# Patient Record
Sex: Male | Born: 1980 | Race: White | Hispanic: No | Marital: Single | Smoking: Current every day smoker
Health system: Southern US, Community
[De-identification: ages and names within clinical notes are randomized; demographics above are authoritative.]

## PROBLEM LIST (undated history)

## (undated) DIAGNOSIS — F329 Major depressive disorder, single episode, unspecified: Secondary | ICD-10-CM

## (undated) DIAGNOSIS — F32A Depression, unspecified: Secondary | ICD-10-CM

## (undated) DIAGNOSIS — F419 Anxiety disorder, unspecified: Secondary | ICD-10-CM

## (undated) DIAGNOSIS — G4700 Insomnia, unspecified: Secondary | ICD-10-CM

## (undated) HISTORY — PX: TONSILLECTOMY: SUR1361

## (undated) HISTORY — DX: Insomnia, unspecified: G47.00

## (undated) HISTORY — DX: Anxiety disorder, unspecified: F41.9

## (undated) HISTORY — DX: Major depressive disorder, single episode, unspecified: F32.9

## (undated) HISTORY — DX: Depression, unspecified: F32.A

---

## 2007-09-13 ENCOUNTER — Emergency Department (HOSPITAL_COMMUNITY): Admission: EM | Admit: 2007-09-13 | Discharge: 2007-09-13 | Payer: Self-pay | Admitting: Family Medicine

## 2013-01-14 ENCOUNTER — Emergency Department: Payer: Self-pay | Admitting: Emergency Medicine

## 2013-01-14 LAB — CBC
HCT: 48.2 % (ref 40.0–52.0)
HGB: 15.5 g/dL (ref 13.0–18.0)
Platelet: 205 10*3/uL (ref 150–440)
RBC: 5.69 10*6/uL (ref 4.40–5.90)
RDW: 14 % (ref 11.5–14.5)

## 2013-01-14 LAB — URINALYSIS, COMPLETE
Bacteria: NONE SEEN
Glucose,UR: NEGATIVE mg/dL (ref 0–75)
Ketone: NEGATIVE
Leukocyte Esterase: NEGATIVE
Ph: 5 (ref 4.5–8.0)
Protein: 100
RBC,UR: 1 /HPF (ref 0–5)
Specific Gravity: 1.024 (ref 1.003–1.030)
WBC UR: 1 /HPF (ref 0–5)

## 2013-01-14 LAB — DRUG SCREEN, URINE
Barbiturates, Ur Screen: NEGATIVE (ref ?–200)
MDMA (Ecstasy)Ur Screen: NEGATIVE (ref ?–500)
Methadone, Ur Screen: NEGATIVE (ref ?–300)
Opiate, Ur Screen: NEGATIVE (ref ?–300)
Phencyclidine (PCP) Ur S: NEGATIVE (ref ?–25)
Tricyclic, Ur Screen: NEGATIVE (ref ?–1000)

## 2013-01-14 LAB — COMPREHENSIVE METABOLIC PANEL
Albumin: 4.3 g/dL (ref 3.4–5.0)
Alkaline Phosphatase: 99 U/L
BUN: 11 mg/dL (ref 7–18)
Bilirubin,Total: 0.3 mg/dL (ref 0.2–1.0)
Chloride: 109 mmol/L — ABNORMAL HIGH (ref 98–107)
Co2: 27 mmol/L (ref 21–32)
Creatinine: 0.74 mg/dL (ref 0.60–1.30)
EGFR (Non-African Amer.): >60
Glucose: 113 mg/dL — ABNORMAL HIGH (ref 65–99)
SGPT (ALT): 115 U/L — ABNORMAL HIGH (ref 12–78)
Sodium: 142 mmol/L (ref 136–145)

## 2013-01-14 LAB — SALICYLATE LEVEL: Salicylates, Serum: <1.7 mg/dL

## 2013-01-14 LAB — ETHANOL: Ethanol: 101 mg/dL

## 2013-02-24 ENCOUNTER — Ambulatory Visit (INDEPENDENT_AMBULATORY_CARE_PROVIDER_SITE_OTHER): Payer: 59 | Admitting: Family Medicine

## 2013-02-24 VITALS — BP 128/92 | HR 99 | Temp 99.5°F | Resp 18 | Ht 68.0 in | Wt 300.0 lb

## 2013-02-24 DIAGNOSIS — I10 Essential (primary) hypertension: Secondary | ICD-10-CM

## 2013-02-24 DIAGNOSIS — G479 Sleep disorder, unspecified: Secondary | ICD-10-CM

## 2013-02-24 DIAGNOSIS — F411 Generalized anxiety disorder: Secondary | ICD-10-CM

## 2013-02-24 DIAGNOSIS — F3289 Other specified depressive episodes: Secondary | ICD-10-CM

## 2013-02-24 DIAGNOSIS — F329 Major depressive disorder, single episode, unspecified: Secondary | ICD-10-CM

## 2013-02-24 DIAGNOSIS — E669 Obesity, unspecified: Secondary | ICD-10-CM

## 2013-02-24 DIAGNOSIS — F32A Depression, unspecified: Secondary | ICD-10-CM

## 2013-02-24 MED ORDER — LORAZEPAM 0.5 MG PO TABS: ORAL_TABLET | ORAL | Status: DC

## 2013-02-24 MED ORDER — FLUOXETINE HCL 20 MG PO CAPS: 20.0000 mg | ORAL_CAPSULE | Freq: Every day | ORAL | Status: DC

## 2013-02-24 NOTE — Patient Instructions (Signed)
Take medication regularly  Followup with you new primary care Dr. and let him check your blood pressure.  Take the lorazepam one at bedtime for sleep.  Get regular exercise and eat less

## 2013-02-24 NOTE — Progress Notes (Signed)
Subjective Patient has a history of depression. For a little over a month he's been on antidepressants, and was much better. He ran out 6 days ago and realizes how he is getting worse again. He would like a refill medications. He has an appointment to see a new family physician at the [facility] in a couple of weeks, but the last he couldn't wait until then to get his medications. Otherwise she's been pretty healthy. His blood pressures crept up with his weight, and today his weight of 300 was a mind opener to him.  Objective: Alert oriented overweight gentleman. Blood pressure elevation was discussed.  Assessment Depression Hypertension Anxiety  Plan: He wait and discuss his blood pressure when he sees he is new primary Care Dr. in a few weeks. Will restart his medications today. Told him that it will take a few days for them to kick in to gear again.

## 2013-03-09 ENCOUNTER — Ambulatory Visit (INDEPENDENT_AMBULATORY_CARE_PROVIDER_SITE_OTHER): Payer: 59 | Admitting: Internal Medicine

## 2013-03-09 ENCOUNTER — Encounter: Payer: Self-pay | Admitting: Internal Medicine

## 2013-03-09 VITALS — BP 148/100 | HR 74 | Temp 97.5°F | Ht 68.25 in | Wt 304.0 lb

## 2013-03-09 DIAGNOSIS — Z0000 Encounter for general adult medical examination without abnormal findings: Secondary | ICD-10-CM

## 2013-03-09 DIAGNOSIS — I10 Essential (primary) hypertension: Secondary | ICD-10-CM

## 2013-03-09 LAB — COMPREHENSIVE METABOLIC PANEL
ALT: 49 U/L (ref 0–53)
AST: 38 U/L — ABNORMAL HIGH (ref 0–37)
Albumin: 4.1 g/dL (ref 3.5–5.2)
Alkaline Phosphatase: 83 U/L (ref 39–117)
BILIRUBIN TOTAL: 0.4 mg/dL (ref 0.2–1.2)
BUN: 17 mg/dL (ref 6–23)
CO2: 25 mEq/L (ref 19–32)
Calcium: 9.5 mg/dL (ref 8.4–10.5)
Chloride: 105 mEq/L (ref 96–112)
Creat: 0.76 mg/dL (ref 0.50–1.35)
Glucose, Bld: 88 mg/dL (ref 70–99)
Potassium: 4.1 mEq/L (ref 3.5–5.3)
Sodium: 139 mEq/L (ref 135–145)
Total Protein: 6.8 g/dL (ref 6.0–8.3)

## 2013-03-09 LAB — LIPID PANEL
CHOL/HDL RATIO: 4.2 ratio
CHOLESTEROL: 171 mg/dL (ref 0–200)
HDL: 41 mg/dL (ref >39)
LDL Cholesterol: 63 mg/dL (ref 0–99)
TRIGLYCERIDES: 335 mg/dL — AB (ref <150)
VLDL: 67 mg/dL — ABNORMAL HIGH (ref 0–40)

## 2013-03-09 LAB — HEMOGLOBIN A1C
Hgb A1c MFr Bld: 5.9 % — ABNORMAL HIGH (ref <5.7)
Mean Plasma Glucose: 123 mg/dL — ABNORMAL HIGH (ref <117)

## 2013-03-09 LAB — CBC
HCT: 42.7 % (ref 39.0–52.0)
HEMOGLOBIN: 14.9 g/dL (ref 13.0–17.0)
MCH: 28.4 pg (ref 26.0–34.0)
MCHC: 34.9 g/dL (ref 30.0–36.0)
MCV: 81.5 fL (ref 78.0–100.0)
Platelets: 202 10*3/uL (ref 150–400)
RBC: 5.24 MIL/uL (ref 4.22–5.81)
RDW: 13.6 % (ref 11.5–15.5)
WBC: 8.5 10*3/uL (ref 4.0–10.5)

## 2013-03-09 LAB — TSH: TSH: 1.648 u[IU]/mL (ref 0.350–4.500)

## 2013-03-09 MED ORDER — LISINOPRIL-HYDROCHLOROTHIAZIDE 10-12.5 MG PO TABS: 1.0000 | ORAL_TABLET | Freq: Every day | ORAL | Status: DC

## 2013-03-09 NOTE — Progress Notes (Signed)
HPI  Pt presents to the clinic today to establish care. [facility] He does have some concerns today about his weight and blood pressure. He has gained a lot of weight since he has been married. He has not tried to work on his diet or exercise. He knows his blood pressure is due to his weight.  Flu: 2013 Tetanus: Unsure of date Dentist: Biannually  Past Medical History  Diagnosis Date  . Depression   . Anxiety   . Insomnia     Current Outpatient Prescriptions  Medication Sig Dispense Refill  . FLUoxetine (PROZAC) 20 MG capsule Take 1 capsule (20 mg total) by mouth daily.  30 capsule  1  . LORazepam (ATIVAN) 0.5 MG tablet Take one at bedtime  30 tablet  0   No current facility-administered medications for this visit.    Allergies  Allergen Reactions  . Tylenol [Acetaminophen]     Family History  Problem Relation Age of Onset  . Mental illness Mother   . Hypertension Father     History   Social History  . Marital Status: Married    Spouse Name: N/A    Number of Children: N/A  . Years of Education: N/A   Occupational History  . Not on file.   Social History Main Topics  . Smoking status: Former Games developermoker  . Smokeless tobacco: Not on file  . Alcohol Use: No  . Drug Use: No  . Sexual Activity: Not on file   Other Topics Concern  . Not on file   Social History Narrative  . No narrative on file    ROS:  Constitutional: Denies fever, malaise, fatigue, headache or abrupt weight changes.  HEENT: Denies eye pain, eye redness, ear pain, ringing in the ears, wax buildup, runny nose, nasal congestion, bloody nose, or sore throat. Respiratory: Denies difficulty breathing, shortness of breath, cough or sputum production.   Cardiovascular: Denies chest pain, chest tightness, palpitations or swelling in the hands or feet.  Gastrointestinal: Denies abdominal pain, bloating, constipation, diarrhea or blood in the stool.  GU: Denies frequency,  urgency, pain with urination, blood in urine, odor or discharge. Musculoskeletal: Denies decrease in range of motion, difficulty with gait, muscle pain or joint pain and swelling.  Skin: Denies redness, rashes, lesions or ulcercations.  Neurological: Denies dizziness, difficulty with memory, difficulty with speech or problems with balance and coordination.   No other specific complaints in a complete review of systems (except as listed in HPI above).  PE:  BP 148/100  Pulse 74  Temp(Src) 97.5 F (36.4 C) (Oral)  Ht 5' 8.25" (1.734 m)  Wt 304 lb (137.893 kg)  BMI 45.86 kg/m2  SpO2 98% Wt Readings from Last 3 Encounters:  03/09/13 304 lb (137.893 kg)  02/24/13 300 lb (136.079 kg)    General: Appears his stated age, obese but well developed, well nourished in NAD. HEENT: Head: normal shape and size; Eyes: sclera white, no icterus, conjunctiva pink, PERRLA and EOMs intact; Ears: Tm's gray and intact, normal light reflex; Nose: mucosa pink and moist, septum midline; Throat/Mouth: Teeth present, mucosa pink and moist, no lesions or ulcerations noted.  Neck: Normal range of motion. Neck supple, trachea midline. No massses, lumps or thyromegaly present.  Cardiovascular: Normal rate and rhythm. S1,S2 noted.  No murmur, rubs or gallops noted. No JVD or BLE edema. No carotid bruits noted. Pulmonary/Chest: Normal effort and positive vesicular breath sounds. No respiratory distress. No wheezes, rales or  ronchi noted.  Abdomen: Soft and nontender. Normal bowel sounds, no bruits noted. No distention or masses noted. Liver, spleen and kidneys non palpable. Musculoskeletal: Normal range of motion. No signs of joint swelling. No difficulty with gait.  Neurological: Alert and oriented. Cranial nerves II-XII intact. Coordination normal. +DTRs bilaterally. Psychiatric: Mood and affect normal. Behavior is normal. Judgment and thought content normal.      Assessment and Plan:  Preventative  Health:  He declines flu and Tdap today Encouraged him to work on diet and exercise Will obtain screening labs today

## 2013-03-09 NOTE — Progress Notes (Signed)
Pre-visit discussion using our clinic review tool. No additional management support is needed unless otherwise documented below in the visit note.  

## 2013-03-09 NOTE — Patient Instructions (Addendum)

## 2013-03-09 NOTE — Assessment & Plan Note (Signed)
Encouraged him to maintain a 2000 calorie diet and try to get at least 4 30 minute sessions of aerobic exercise in per week.

## 2013-03-09 NOTE — Assessment & Plan Note (Signed)
Will start lisinopril-hct Encouraged him to work on diet and exercise with a goal of 10 lb weight loss over the next month  RTC in 1 month to recheck BP

## 2013-04-05 ENCOUNTER — Other Ambulatory Visit: Payer: Self-pay | Admitting: Internal Medicine

## 2013-05-17 ENCOUNTER — Ambulatory Visit (INDEPENDENT_AMBULATORY_CARE_PROVIDER_SITE_OTHER): Payer: 59 | Admitting: Internal Medicine

## 2013-05-17 ENCOUNTER — Encounter: Payer: Self-pay | Admitting: Internal Medicine

## 2013-05-17 VITALS — BP 146/92 | HR 98 | Temp 98.7°F | Wt 296.0 lb

## 2013-05-17 DIAGNOSIS — F419 Anxiety disorder, unspecified: Secondary | ICD-10-CM

## 2013-05-17 DIAGNOSIS — F32A Depression, unspecified: Secondary | ICD-10-CM

## 2013-05-17 DIAGNOSIS — F3289 Other specified depressive episodes: Secondary | ICD-10-CM

## 2013-05-17 DIAGNOSIS — I10 Essential (primary) hypertension: Secondary | ICD-10-CM

## 2013-05-17 DIAGNOSIS — F329 Major depressive disorder, single episode, unspecified: Secondary | ICD-10-CM

## 2013-05-17 MED ORDER — LISINOPRIL-HYDROCHLOROTHIAZIDE 20-25 MG PO TABS: 1.0000 | ORAL_TABLET | Freq: Every day | ORAL | Status: DC

## 2013-05-17 MED ORDER — FLUOXETINE HCL 20 MG PO CAPS: 20.0000 mg | ORAL_CAPSULE | Freq: Every day | ORAL | Status: DC

## 2013-05-17 NOTE — Assessment & Plan Note (Signed)
Not a whole lot of improvement since starting medication Will increase lisinopril-HCT to 20-25  RTC in 1 month to recheck BP and BMET

## 2013-05-17 NOTE — Progress Notes (Signed)
Subjective:    Patient ID: Anthony Myers, male    DOB: 07/25/1980, 33 y.o.   MRN: [id number]  HPI  Pt presents to the clinic today for followup and med refills.  Depression: On prozac and ativan. Only takes ativan when needed. He denies SI/HI.  HTN: No real improvement lisinopril-HCT. He has lost 8 lbs.  Review of Systems      Past Medical History  Diagnosis Date  . Depression   . Anxiety   . Insomnia     Current Outpatient Prescriptions  Medication Sig Dispense Refill  . FLUoxetine (PROZAC) 20 MG capsule Take 1 capsule (20 mg total) by mouth daily.  30 capsule  1  . lisinopril-hydrochlorothiazide (PRINZIDE,ZESTORETIC) 10-12.5 MG per tablet Take 1 tablet by mouth daily.  30 tablet  0  . LORazepam (ATIVAN) 0.5 MG tablet Take one at bedtime  30 tablet  0   No current facility-administered medications for this visit.    Allergies  Allergen Reactions  . Tylenol [Acetaminophen]     Family History  Problem Relation Age of Onset  . Mental illness Mother   . Hypertension Father     History   Social History  . Marital Status: Married    Spouse Name: N/A    Number of Children: N/A  . Years of Education: N/A   Occupational History  . Not on file.   Social History Main Topics  . Smoking status: Former Games developermoker  . Smokeless tobacco: Not on file  . Alcohol Use: No  . Drug Use: No  . Sexual Activity: Not on file   Other Topics Concern  . Not on file   Social History Narrative  . No narrative on file     Constitutional: Denies fever, malaise, fatigue, headache or abrupt weight changes.  Respiratory: Denies difficulty breathing, shortness of breath, cough or sputum production.   Cardiovascular: Denies chest pain, chest tightness, palpitations or swelling in the hands or feet.  Neurological: Denies dizziness, difficulty with memory, difficulty with speech or problems with balance and coordination.  Psych: Pt reports depression, mild anxiety. Denies SI/HI.  No  other specific complaints in a complete review of systems (except as listed in HPI above).  Objective:   Physical Exam   BP 146/92  Pulse 98  Temp(Src) 98.7 F (37.1 C) (Oral)  Wt 296 lb (134.265 kg)  SpO2 98% Wt Readings from Last 3 Encounters:  05/17/13 296 lb (134.265 kg)  03/09/13 304 lb (137.893 kg)  02/24/13 300 lb (136.079 kg)    General: Appears his stated age, obese but well developed, well nourished in NAD. Cardiovascular: Normal rate and rhythm. S1,S2 noted.  No murmur, rubs or gallops noted. No JVD or BLE edema. No carotid bruits noted. Pulmonary/Chest: Normal effort and positive vesicular breath sounds. No respiratory distress. No wheezes, rales or ronchi noted.  Psychiatric: Mood and affect normal. Behavior is normal. Judgment and thought content normal.     BMET    Component Value Date/Time   NA 139 03/09/2013 1601   K 4.1 03/09/2013 1601   CL 105 03/09/2013 1601   CO2 25 03/09/2013 1601   GLUCOSE 88 03/09/2013 1601   BUN 17 03/09/2013 1601   CREATININE 0.76 03/09/2013 1601   CALCIUM 9.5 03/09/2013 1601    Lipid Panel     Component Value Date/Time   CHOL 171 03/09/2013 1601   TRIG 335* 03/09/2013 1601   HDL 41 03/09/2013 1601   CHOLHDL 4.2 03/09/2013 1601  VLDL 67* 03/09/2013 1601   LDLCALC 63 03/09/2013 1601    CBC    Component Value Date/Time   WBC 8.5 03/09/2013 1601   RBC 5.24 03/09/2013 1601   HGB 14.9 03/09/2013 1601   HCT 42.7 03/09/2013 1601   PLT 202 03/09/2013 1601   MCV 81.5 03/09/2013 1601   MCH 28.4 03/09/2013 1601   MCHC 34.9 03/09/2013 1601   RDW 13.6 03/09/2013 1601    Hgb A1C Lab Results  Component Value Date   HGBA1C 5.9* 03/09/2013        Assessment & Plan:

## 2013-05-17 NOTE — Assessment & Plan Note (Signed)
He has lost 8 lbs Encouraged him to continue to work on diet and exercise

## 2013-05-17 NOTE — Patient Instructions (Addendum)

## 2013-05-17 NOTE — Assessment & Plan Note (Signed)
Controlled on Prozac with supplemental ativan Medication refilled today

## 2013-05-17 NOTE — Progress Notes (Signed)
Pre visit review using our clinic review tool, if applicable. No additional management support is needed unless otherwise documented below in the visit note. 

## 2013-05-18 ENCOUNTER — Telehealth: Payer: Self-pay | Admitting: Internal Medicine

## 2013-05-18 NOTE — Telephone Encounter (Signed)
Relevant patient education assigned to patient using Emmi. ° °

## 2013-06-21 ENCOUNTER — Other Ambulatory Visit: Payer: Self-pay | Admitting: Internal Medicine

## 2013-07-10 ENCOUNTER — Other Ambulatory Visit: Payer: Self-pay | Admitting: Internal Medicine

## 2013-07-23 ENCOUNTER — Other Ambulatory Visit: Payer: Self-pay | Admitting: Internal Medicine

## 2013-07-23 DIAGNOSIS — F329 Major depressive disorder, single episode, unspecified: Secondary | ICD-10-CM

## 2013-07-23 DIAGNOSIS — F3289 Other specified depressive episodes: Secondary | ICD-10-CM

## 2013-07-23 NOTE — Telephone Encounter (Signed)
Last OV and last filled 05/17/13--please advise

## 2013-08-14 ENCOUNTER — Other Ambulatory Visit: Payer: Self-pay | Admitting: Internal Medicine

## 2013-10-18 ENCOUNTER — Other Ambulatory Visit: Payer: Self-pay | Admitting: Internal Medicine

## 2013-10-19 ENCOUNTER — Other Ambulatory Visit: Payer: Self-pay | Admitting: Family Medicine

## 2013-10-19 DIAGNOSIS — F329 Major depressive disorder, single episode, unspecified: Secondary | ICD-10-CM

## 2013-10-19 DIAGNOSIS — F3289 Other specified depressive episodes: Secondary | ICD-10-CM

## 2013-10-19 NOTE — Telephone Encounter (Signed)
Pt left vm on triage phone stating that he requested Prozac and he has not heard anything back.  Shawna Orleans, CMA denied request on 9/24.  Pt was last seen on 05/17/13 for depression.  Ok to fill or does pt need another appt?

## 2013-10-22 MED ORDER — FLUOXETINE HCL 20 MG PO CAPS: ORAL_CAPSULE | ORAL | Status: DC

## 2013-11-29 ENCOUNTER — Ambulatory Visit (INDEPENDENT_AMBULATORY_CARE_PROVIDER_SITE_OTHER): Payer: 59 | Admitting: Internal Medicine

## 2013-11-29 ENCOUNTER — Encounter: Payer: Self-pay | Admitting: Internal Medicine

## 2013-11-29 VITALS — BP 148/110 | HR 87 | Temp 98.1°F | Wt 314.0 lb

## 2013-11-29 DIAGNOSIS — I10 Essential (primary) hypertension: Secondary | ICD-10-CM

## 2013-11-29 DIAGNOSIS — H6091 Unspecified otitis externa, right ear: Secondary | ICD-10-CM

## 2013-11-29 DIAGNOSIS — H65111 Acute and subacute allergic otitis media (mucoid) (sanguinous) (serous), right ear: Secondary | ICD-10-CM | POA: Diagnosis not present

## 2013-11-29 MED ORDER — LISINOPRIL 20 MG PO TABS: 20.0000 mg | ORAL_TABLET | Freq: Every day | ORAL | Status: DC

## 2013-11-29 MED ORDER — AMOXICILLIN 875 MG PO TABS: 875.0000 mg | ORAL_TABLET | Freq: Two times a day (BID) | ORAL | Status: DC

## 2013-11-29 MED ORDER — AMLODIPINE BESYLATE 5 MG PO TABS: 5.0000 mg | ORAL_TABLET | Freq: Every day | ORAL | Status: DC

## 2013-11-29 NOTE — Progress Notes (Signed)
Subjective:    Patient ID: Anthony Myers, male    DOB: 04/16/1980, 33 y.o.   MRN: [id number]  HPI Pt is a 33 yr old male presenting with right ear pain.  Pt has a PMH of chronic ear infections, had tubes placed when 33 yrs old and removed at 33 yrs old.  Pain started yesterday night, felt that there was something in his ear.  Pain worse over night and feels like there is drainage in his ear.  Pt reports redness, pain and swelling around his right ear.  Pt denies fever, N/V or diarrhea  Pt also mentions his HTN.  Pt has a BP of 148/110 at presentation today.  Pt was prescribed lisinopril/HCTZ 20-25mg  combo in April.  Pt did not come for follow-up and has made 30 tables last from April to November.  Pt last reports taking the medication 5 days prior to visit.  Pt is taking medication at night and is waking up to pee, wants to try new BP medication.   Review of Systems  Past Medical History  Diagnosis Date  . Depression   . Anxiety   . Insomnia     Current Outpatient Prescriptions  Medication Sig Dispense Refill  . FLUoxetine (PROZAC) 20 MG capsule TAKE 1 CAPSULE (20 MG TOTAL) BY MOUTH DAILY. 30 capsule 1  . LORazepam (ATIVAN) 0.5 MG tablet Take one at bedtime 30 tablet 0  . amLODipine (NORVASC) 5 MG tablet Take 1 tablet (5 mg total) by mouth daily. 30 tablet 0  . amoxicillin (AMOXIL) 875 MG tablet Take 1 tablet (875 mg total) by mouth 2 (two) times daily. 20 tablet 0  . lisinopril (PRINIVIL,ZESTRIL) 20 MG tablet Take 1 tablet (20 mg total) by mouth daily. 30 tablet 0   No current facility-administered medications for this visit.    Allergies  Allergen Reactions  . Tylenol [Acetaminophen]     Family History  Problem Relation Age of Onset  . Mental illness Mother   . Hypertension Father     History   Social History  . Marital Status: Married    Spouse Name: N/A    Number of Children: N/A  . Years of Education: N/A   Occupational History  . Not on file.   Social  History Main Topics  . Smoking status: Former Games developermoker  . Smokeless tobacco: Not on file  . Alcohol Use: No  . Drug Use: No  . Sexual Activity: Not on file   Other Topics Concern  . Not on file   Social History Narrative   Constitutional: Denies fever, malaise, fatigue, headache or abrupt weight changes.  HEENT: Positive ear pain, ear drainage.  Denies eye pain, eye redness, ringing in the ears, wax buildup, runny nose, nasal congestion, bloody nose, or sore throat. Respiratory: Denies difficulty breathing, shortness of breath, cough or sputum production.   Cardiovascular: Denies chest pain, chest tightness, palpitations or swelling in the hands or feet.  Skin: Positive redness around right ear.  Denies rashes, lesions or ulcercations.   No other specific complaints in a complete review of systems (except as listed in HPI above).     Objective:   Physical Exam BP 148/110 mmHg  Pulse 87  Temp(Src) 98.1 F (36.7 C) (Oral)  Wt 314 lb (142.429 kg)  SpO2 98% Wt Readings from Last 3 Encounters:  11/29/13 314 lb (142.429 kg)  05/17/13 296 lb (134.265 kg)  03/09/13 304 lb (137.893 kg)    General: Appears his stated age,  well developed, well nourished in NAD.  Pt has gained weight since previous visit. Skin: Pt has a flushed appearance HEENT: Head: normal shape and size; Eyes: sclera white, no icterus, conjunctiva pink, PERRLA and EOMs intact; Ears: TM intact left ear, Right ear: erythematous canal with perforated TM with noticeable pus draining; Throat/Mouth: Teeth present, mucosa pink and moist, no exudate, lesions or ulcerations noted.  Neck: No JVD or cervical lymphadenopathy noted Cardiovascular: Normal rate and rhythm. S1,S2 noted.  No murmur, rubs or gallops noted. No JVD or BLE edema. No carotid bruits noted. Pulmonary/Chest: Normal effort and positive vesicular breath sounds. No respiratory distress. No wheezes, rales or ronchi noted.   EKG:  BMET    Component Value  Date/Time   NA 139 03/09/2013 1601   K 4.1 03/09/2013 1601   CL 105 03/09/2013 1601   CO2 25 03/09/2013 1601   GLUCOSE 88 03/09/2013 1601   BUN 17 03/09/2013 1601   CREATININE 0.76 03/09/2013 1601   CALCIUM 9.5 03/09/2013 1601    Lipid Panel     Component Value Date/Time   CHOL 171 03/09/2013 1601   TRIG 335* 03/09/2013 1601   HDL 41 03/09/2013 1601   CHOLHDL 4.2 03/09/2013 1601   VLDL 67* 03/09/2013 1601   LDLCALC 63 03/09/2013 1601    CBC    Component Value Date/Time   WBC 8.5 03/09/2013 1601   RBC 5.24 03/09/2013 1601   HGB 14.9 03/09/2013 1601   HCT 42.7 03/09/2013 1601   PLT 202 03/09/2013 1601   MCV 81.5 03/09/2013 1601   MCH 28.4 03/09/2013 1601   MCHC 34.9 03/09/2013 1601   RDW 13.6 03/09/2013 1601    Hgb A1C Lab Results  Component Value Date   HGBA1C 5.9* 03/09/2013      Assessment & Plan:  Otitis Media with associated otitis externa -Prescribed Amoxil BID x 10 days as well as Depo-medrol injection -Told the pt to try to keep moisture out of right ear and try to prevent anything getting into his ear -RTC if symptoms worsen or have not improved following antibiotic  Essential HTN -Pt does not wish to continue lisinopril/HCTZ combo, will D/C -Placed pt on lisinopril 20mg  and Amlodipine 5mg  -F/U in 30 days for re-check of BP and re-evaluation of BP medications

## 2013-11-29 NOTE — Assessment & Plan Note (Signed)
He is bothered by the urinary frequency Will stop Lisinopril-HCT RX for Lisinopril 20 mg daily RX for Norvasc 5 mg daily  RTC in 1 month or sooner if needed

## 2013-11-29 NOTE — Progress Notes (Signed)
Pre visit review using our clinic review tool, if applicable. No additional management support is needed unless otherwise documented below in the visit note. 

## 2013-11-29 NOTE — Progress Notes (Signed)
Subjective:    Patient ID: Anthony BarriosJeremy Myers, male    DOB: 12/13/1980, 33 y.o.   MRN: [id number]  HPI  Pt presents to the clinic today with c/o right ear pain/pressure. He reports this started yesterday. He has noticed swelling around his ear and cheek. He has also had some associated dizziness. He denies fever or chills. He reports history of chronic ear infections. He does report having a "tube" placed in his right ear at age 33 removed at age of 33.  Of note, his BP is elevated today at 148/110. He was started on Lisinopril-HCT 04/2013. He reports that he took it for a little while but essentially stopped it because his was having urinary frequency. He never came back for a follow up appt after that.  Review of Systems      Past Medical History  Diagnosis Date  . Depression   . Anxiety   . Insomnia     Current Outpatient Prescriptions  Medication Sig Dispense Refill  . FLUoxetine (PROZAC) 20 MG capsule TAKE 1 CAPSULE (20 MG TOTAL) BY MOUTH DAILY. 30 capsule 1  . LORazepam (ATIVAN) 0.5 MG tablet Take one at bedtime 30 tablet 0  . lisinopril-hydrochlorothiazide (PRINZIDE,ZESTORETIC) 20-25 MG per tablet TAKE 1 TABLET BY MOUTH DAILY. 30 tablet 0   No current facility-administered medications for this visit.    Allergies  Allergen Reactions  . Tylenol [Acetaminophen]     Family History  Problem Relation Age of Onset  . Mental illness Mother   . Hypertension Father     History   Social History  . Marital Status: Married    Spouse Name: N/A    Number of Children: N/A  . Years of Education: N/A   Occupational History  . Not on file.   Social History Main Topics  . Smoking status: Former Games developermoker  . Smokeless tobacco: Not on file  . Alcohol Use: No  . Drug Use: No  . Sexual Activity: Not on file   Other Topics Concern  . Not on file   Social History Narrative     Constitutional: Denies fever, malaise, fatigue, headache or abrupt weight changes.  HEENT: Pt  reports right ear pain. Denies eye pain, eye redness, ringing in the ears, wax buildup, runny nose, nasal congestion, bloody nose, or sore throat. Respiratory: Denies difficulty breathing, shortness of breath, cough or sputum production.   Cardiovascular: Denies chest pain, chest tightness, palpitations or swelling in the hands or feet.  Neurological: Denies dizziness, difficulty with memory, difficulty with speech or problems with balance and coordination.   No other specific complaints in a complete review of systems (except as listed in HPI above).   Objective:   Physical Exam   BP 148/110 mmHg  Pulse 87  Temp(Src) 98.1 F (36.7 C) (Oral)  Wt 314 lb (142.429 kg)  SpO2 98% Wt Readings from Last 3 Encounters:  11/29/13 314 lb (142.429 kg)  05/17/13 296 lb (134.265 kg)  03/09/13 304 lb (137.893 kg)    General: Appears his stated age, obese but well developed, well nourished in NAD. HEENT: Head: normal shape and size; Ears: right canal erythmetous; pus noted in eardrum. Hole noted in eardrum. Nose: mucosa pink and moist, septum midline; Throat/Mouth: Teeth present, mucosa pink and moist, no exudate, lesions or ulcerations noted.  Cardiovascular: Normal rate and rhythm. S1,S2 noted.  No murmur, rubs or gallops noted. No JVD or BLE edema. No carotid bruits noted. Pulmonary/Chest: Normal effort and positive vesicular breath  sounds. No respiratory distress. No wheezes, rales or ronchi noted.  Neurological: Alert and oriented.   BMET    Component Value Date/Time   NA 139 03/09/2013 1601   K 4.1 03/09/2013 1601   CL 105 03/09/2013 1601   CO2 25 03/09/2013 1601   GLUCOSE 88 03/09/2013 1601   BUN 17 03/09/2013 1601   CREATININE 0.76 03/09/2013 1601   CALCIUM 9.5 03/09/2013 1601    Lipid Panel     Component Value Date/Time   CHOL 171 03/09/2013 1601   TRIG 335* 03/09/2013 1601   HDL 41 03/09/2013 1601   CHOLHDL 4.2 03/09/2013 1601   VLDL 67* 03/09/2013 1601   LDLCALC 63  03/09/2013 1601    CBC    Component Value Date/Time   WBC 8.5 03/09/2013 1601   RBC 5.24 03/09/2013 1601   HGB 14.9 03/09/2013 1601   HCT 42.7 03/09/2013 1601   PLT 202 03/09/2013 1601   MCV 81.5 03/09/2013 1601   MCH 28.4 03/09/2013 1601   MCHC 34.9 03/09/2013 1601   RDW 13.6 03/09/2013 1601    Hgb A1C Lab Results  Component Value Date   HGBA1C 5.9* 03/09/2013        Assessment & Plan:   Right Otitis Media with Otitis Externa:  eRx for Amoxil BID x 10 days  80 mg Depo IM today No otic drops due to hole in ear drum

## 2013-11-29 NOTE — Patient Instructions (Signed)

## 2013-12-03 MED ORDER — METHYLPREDNISOLONE ACETATE 80 MG/ML IJ SUSP
80.0000 mg | Freq: Once | INTRAMUSCULAR | Status: AC
Start: 2013-12-03 — End: 2013-12-03
  Administered 2013-12-03: 80 mg via INTRAMUSCULAR

## 2013-12-03 NOTE — Addendum Note (Signed)
Addended by: Roena MaladyEVONTENNO, Layaan Mott Y on: 12/03/2013 05:01 PM   Modules accepted: Orders

## 2013-12-25 ENCOUNTER — Other Ambulatory Visit: Payer: Self-pay

## 2013-12-25 DIAGNOSIS — F329 Major depressive disorder, single episode, unspecified: Secondary | ICD-10-CM

## 2013-12-25 DIAGNOSIS — F32A Depression, unspecified: Secondary | ICD-10-CM

## 2013-12-25 MED ORDER — FLUOXETINE HCL 20 MG PO CAPS: ORAL_CAPSULE | ORAL | Status: DC

## 2013-12-25 NOTE — Telephone Encounter (Signed)
Last filled 10/22/2013 with 1 refill--please advise--also pt has not made a 1 mth f/u appt for BP

## 2013-12-25 NOTE — Telephone Encounter (Signed)
i sent in the prozac, call pt to make follow up appt for BP

## 2013-12-26 NOTE — Telephone Encounter (Signed)
Lm with a male who answered phone to have pt return my call

## 2014-01-11 ENCOUNTER — Ambulatory Visit (INDEPENDENT_AMBULATORY_CARE_PROVIDER_SITE_OTHER): Payer: 59 | Admitting: Internal Medicine

## 2014-01-11 ENCOUNTER — Encounter: Payer: Self-pay | Admitting: Internal Medicine

## 2014-01-11 VITALS — BP 124/86 | HR 94 | Temp 98.5°F | Wt 303.0 lb

## 2014-01-11 DIAGNOSIS — I10 Essential (primary) hypertension: Secondary | ICD-10-CM

## 2014-01-11 DIAGNOSIS — R252 Cramp and spasm: Secondary | ICD-10-CM

## 2014-01-11 LAB — CBC WITH DIFFERENTIAL/PLATELET
BASOS ABS: 0.1 10*3/uL (ref 0.0–0.1)
Basophils Relative: 1 % (ref 0–1)
Eosinophils Absolute: 0.2 10*3/uL (ref 0.0–0.7)
Eosinophils Relative: 2 % (ref 0–5)
HEMATOCRIT: 44.7 % (ref 39.0–52.0)
Hemoglobin: 15.2 g/dL (ref 13.0–17.0)
LYMPHS PCT: 30 % (ref 12–46)
Lymphs Abs: 2.6 10*3/uL (ref 0.7–4.0)
MCH: 29.7 pg (ref 26.0–34.0)
MCHC: 34 g/dL (ref 30.0–36.0)
MCV: 87.5 fL (ref 78.0–100.0)
MONO ABS: 0.9 10*3/uL (ref 0.1–1.0)
MONOS PCT: 10 % (ref 3–12)
MPV: 10.3 fL (ref 9.4–12.4)
NEUTROS ABS: 5 10*3/uL (ref 1.7–7.7)
NEUTROS PCT: 57 % (ref 43–77)
PLATELETS: 215 10*3/uL (ref 150–400)
RBC: 5.11 MIL/uL (ref 4.22–5.81)
RDW: 14.4 % (ref 11.5–15.5)
WBC: 8.7 10*3/uL (ref 4.0–10.5)

## 2014-01-11 MED ORDER — AMLODIPINE BESYLATE 5 MG PO TABS: 5.0000 mg | ORAL_TABLET | Freq: Every day | ORAL | Status: DC

## 2014-01-11 MED ORDER — LISINOPRIL 20 MG PO TABS: 20.0000 mg | ORAL_TABLET | Freq: Every day | ORAL | Status: DC

## 2014-01-11 NOTE — Patient Instructions (Signed)

## 2014-01-11 NOTE — Assessment & Plan Note (Addendum)
Well controlled on Lisinopril and Norvasc Will check CMET today

## 2014-01-11 NOTE — Addendum Note (Signed)
Addended by: Baldomero LamyHAVERS, Ammarie Matsuura C on: 01/11/2014 03:33 PM   Modules accepted: Orders, SmartSet

## 2014-01-11 NOTE — Progress Notes (Signed)
Subjective:    Patient ID: Anthony Myers, male    DOB: 07/30/1980, 33 y.o.   MRN: [id number]  HPI  Pt presents to the clinic today for 1 month follow up of HTN. At his last visit, his BP was 148/110. He did not want to take the Lisinopril-HCT because he was having urinary frequency. We stopped that and put him on Lisinopril 20 mg and Norvasc 5 mg daily. He has been taking it as directed. He has lost 11 lbs in the last month. He does c/o mild muscle cramping in his legs. He does drink plenty of water.  Review of Systems      Past Medical History  Diagnosis Date  . Depression   . Anxiety   . Insomnia     Current Outpatient Prescriptions  Medication Sig Dispense Refill  . amLODipine (NORVASC) 5 MG tablet Take 1 tablet (5 mg total) by mouth daily. 30 tablet 0  . FLUoxetine (PROZAC) 20 MG capsule TAKE 1 CAPSULE (20 MG TOTAL) BY MOUTH DAILY. 30 capsule 5  . lisinopril (PRINIVIL,ZESTRIL) 20 MG tablet Take 1 tablet (20 mg total) by mouth daily. 30 tablet 0  . LORazepam (ATIVAN) 0.5 MG tablet Take one at bedtime 30 tablet 0   No current facility-administered medications for this visit.    Allergies  Allergen Reactions  . Tylenol [Acetaminophen]     Family History  Problem Relation Age of Onset  . Mental illness Mother   . Hypertension Father     History   Social History  . Marital Status: Married    Spouse Name: N/A    Number of Children: N/A  . Years of Education: N/A   Occupational History  . Not on file.   Social History Main Topics  . Smoking status: Former Games developermoker  . Smokeless tobacco: Not on file  . Alcohol Use: No  . Drug Use: No  . Sexual Activity: Not on file   Other Topics Concern  . Not on file   Social History Narrative     Constitutional: Denies fever, malaise, fatigue, headache or abrupt weight changes.  Respiratory: Denies difficulty breathing, shortness of breath, cough or sputum production.   Cardiovascular: Denies chest pain, chest  tightness, palpitations or swelling in the hands or feet.  MSK: Pt reports muscle cramps. Denies joint pain, swelling or difficulty with gait. Neurological: Denies dizziness, difficulty with memory, difficulty with speech or problems with balance and coordination.   No other specific complaints in a complete review of systems (except as listed in HPI above).  Objective:   Physical Exam  BP 124/86 mmHg  Pulse 94  Temp(Src) 98.5 F (36.9 C) (Oral)  Wt 303 lb (137.44 kg)  SpO2 98% Wt Readings from Last 3 Encounters:  01/11/14 303 lb (137.44 kg)  11/29/13 314 lb (142.429 kg)  05/17/13 296 lb (134.265 kg)    General: Appears his stated age, obese but well developed, well nourished in NAD. Cardiovascular: Normal rate and rhythm. S1,S2 noted.  No murmur, rubs or gallops noted. No JVD or BLE edema. No carotid bruits noted. Pulmonary/Chest: Normal effort and positive vesicular breath sounds. No respiratory distress. No wheezes, rales or ronchi noted.  MSK: Calf muscles equal, soft, nontender with palpation. Negative Homans. Strength 5/5 BLE. Neurological: Alert and oriented.   BMET    Component Value Date/Time   NA 139 03/09/2013 1601   K 4.1 03/09/2013 1601   CL 105 03/09/2013 1601   CO2 25 03/09/2013 1601  GLUCOSE 88 03/09/2013 1601   BUN 17 03/09/2013 1601   CREATININE 0.76 03/09/2013 1601   CALCIUM 9.5 03/09/2013 1601    Lipid Panel     Component Value Date/Time   CHOL 171 03/09/2013 1601   TRIG 335* 03/09/2013 1601   HDL 41 03/09/2013 1601   CHOLHDL 4.2 03/09/2013 1601   VLDL 67* 03/09/2013 1601   LDLCALC 63 03/09/2013 1601    CBC    Component Value Date/Time   WBC 8.5 03/09/2013 1601   RBC 5.24 03/09/2013 1601   HGB 14.9 03/09/2013 1601   HCT 42.7 03/09/2013 1601   PLT 202 03/09/2013 1601   MCV 81.5 03/09/2013 1601   MCH 28.4 03/09/2013 1601   MCHC 34.9 03/09/2013 1601   RDW 13.6 03/09/2013 1601    Hgb A1C Lab Results  Component Value Date   HGBA1C  5.9* 03/09/2013        Assessment & Plan:   Muscles cramps:  Make sure you are drinking lots of fluids Will check CMET today  RTC in 6 months for physical exam

## 2014-01-11 NOTE — Telephone Encounter (Signed)
Pt made an appt today

## 2014-01-11 NOTE — Progress Notes (Signed)
Pre visit review using our clinic review tool, if applicable. No additional management support is needed unless otherwise documented below in the visit note. 

## 2014-05-07 ENCOUNTER — Other Ambulatory Visit: Payer: Self-pay

## 2014-05-07 DIAGNOSIS — I10 Essential (primary) hypertension: Secondary | ICD-10-CM

## 2014-05-07 MED ORDER — LISINOPRIL 20 MG PO TABS: ORAL_TABLET | ORAL | Status: DC

## 2014-05-08 MED ORDER — LISINOPRIL 20 MG PO TABS: ORAL_TABLET | ORAL | Status: DC

## 2014-05-08 MED ORDER — AMLODIPINE BESYLATE 5 MG PO TABS: 5.0000 mg | ORAL_TABLET | Freq: Every day | ORAL | Status: DC

## 2014-05-08 NOTE — Addendum Note (Signed)
Addended by: Roena MaladyEVONTENNO, Veronia Laprise Y on: 05/08/2014 11:09 AM   Modules accepted: Orders
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
## 2014-06-21 ENCOUNTER — Other Ambulatory Visit: Payer: Self-pay

## 2014-06-21 DIAGNOSIS — F32A Depression, unspecified: Secondary | ICD-10-CM

## 2014-06-21 DIAGNOSIS — F329 Major depressive disorder, single episode, unspecified: Secondary | ICD-10-CM

## 2014-06-21 MED ORDER — FLUOXETINE HCL 20 MG PO CAPS: ORAL_CAPSULE | ORAL | Status: DC

## 2014-07-08 ENCOUNTER — Encounter: Payer: Self-pay | Admitting: Internal Medicine

## 2014-07-08 ENCOUNTER — Ambulatory Visit (INDEPENDENT_AMBULATORY_CARE_PROVIDER_SITE_OTHER): Payer: BLUE CROSS/BLUE SHIELD | Admitting: Internal Medicine

## 2014-07-08 ENCOUNTER — Ambulatory Visit (INDEPENDENT_AMBULATORY_CARE_PROVIDER_SITE_OTHER)
Admission: RE | Admit: 2014-07-08 | Discharge: 2014-07-08 | Disposition: A | Discharge status: Home / Self Care | Payer: BLUE CROSS/BLUE SHIELD | Source: Ambulatory Visit | Attending: Internal Medicine | Admitting: Internal Medicine

## 2014-07-08 VITALS — BP 134/80 | HR 96 | Temp 97.8°F | Wt 295.0 lb

## 2014-07-08 DIAGNOSIS — R0789 Other chest pain: Secondary | ICD-10-CM

## 2014-07-08 DIAGNOSIS — I10 Essential (primary) hypertension: Secondary | ICD-10-CM

## 2014-07-08 DIAGNOSIS — F329 Major depressive disorder, single episode, unspecified: Secondary | ICD-10-CM

## 2014-07-08 DIAGNOSIS — F32A Depression, unspecified: Secondary | ICD-10-CM

## 2014-07-08 MED ORDER — AMLODIPINE BESYLATE 5 MG PO TABS: 5.0000 mg | ORAL_TABLET | Freq: Every day | ORAL | Status: DC

## 2014-07-08 MED ORDER — LISINOPRIL 20 MG PO TABS
20.0000 mg | ORAL_TABLET | Freq: Every day | ORAL | Status: DC
Start: 2014-07-08 — End: 2015-03-19

## 2014-07-08 MED ORDER — FLUOXETINE HCL 20 MG PO CAPS: ORAL_CAPSULE | ORAL | Status: DC

## 2014-07-08 NOTE — Assessment & Plan Note (Signed)
Encouraged him to work on diet and exercise 

## 2014-07-08 NOTE — Progress Notes (Signed)
Pre visit review using our clinic review tool, if applicable. No additional management support is needed unless otherwise documented below in the visit note. 

## 2014-07-08 NOTE — Assessment & Plan Note (Signed)
Well controlled on Prozac Will check CBC and CMET

## 2014-07-08 NOTE — Assessment & Plan Note (Signed)
Well controlled on Lisinopril and Norvasc Encouraged him to work on diet and exercise

## 2014-07-08 NOTE — Patient Instructions (Signed)

## 2014-07-08 NOTE — Progress Notes (Addendum)
Subjective:    Patient ID: Anthony Myers, male    DOB: Nov 01, 1980, 34 y.o.   MRN: [id number]  HPI  Pt presents to the clinic today for 6 month follow up of medical conditions.  HTN: BP well controlled on Lisinopril and Norvasc. He denies adverse effects. His BP today is 134/80.  Obesity: He has lost 8 lb since his last visit. His BMI is 44.5.  Depression: Chronic but stable on Prozac. He will need a refill of his medication today.  He also c/o pain in his LUQ/chest wall. This started over the weekend while he was using a wave runner. He was trying to get up on the back when the metal foot step rammed him in the chest. He has pain with taking a deep breath. He denies shortness of breath. He has not tried anything OTC.  Review of Systems      Past Medical History  Diagnosis Date  . Depression   . Anxiety   . Insomnia     Current Outpatient Prescriptions  Medication Sig Dispense Refill  . amLODipine (NORVASC) 5 MG tablet Take 1 tablet (5 mg total) by mouth daily. NEED TO SCHEDULE ANNUAL PHYSICAL FOR MORE REFILLS 90 tablet 1  . FLUoxetine (PROZAC) 20 MG capsule TAKE 1 CAPSULE (20 MG TOTAL) BY MOUTH DAILY. NEED TO SCHEDULE AN ANNUAL PHYSICAL FOR MORE REFILLS 30 capsule 1  . lisinopril (PRINIVIL,ZESTRIL) 20 MG tablet NEED TO SCHEDULE ANNUAL PHYSICAL FOR MORE REFILLS 90 tablet 0   No current facility-administered medications for this visit.    Allergies  Allergen Reactions  . Tylenol [Acetaminophen]     Family History  Problem Relation Age of Onset  . Mental illness Mother   . Hypertension Father     History   Social History  . Marital Status: Married    Spouse Name: N/A  . Number of Children: N/A  . Years of Education: N/A   Occupational History  . Not on file.   Social History Main Topics  . Smoking status: Former Games developer  . Smokeless tobacco: Not on file  . Alcohol Use: No  . Drug Use: No  . Sexual Activity: Not on file   Other Topics Concern  . Not on  file   Social History Narrative     Constitutional: Denies fever, malaise, fatigue, headache or abrupt weight changes.  Respiratory: Denies difficulty breathing, shortness of breath, cough or sputum production.   Cardiovascular: Denies chest pain, chest tightness, palpitations or swelling in the hands or feet.  Musculoskeletal: Pt reports chest wall pain. Denies decrease in range of motion, difficulty with gait, muscle pain or joint pain and swelling.  Skin: Denies redness, rashes, lesions or ulcercations.  Neurological: Denies dizziness, difficulty with memory, difficulty with speech or problems with balance and coordination.  Psych: Pt reports chronic depression. Denies anxiety, SI/HI.  No other specific complaints in a complete review of systems (except as listed in HPI above).  Objective:   Physical Exam  BP 134/80 mmHg  Pulse 96  Temp(Src) 97.8 F (36.6 C) (Oral)  Wt 295 lb (133.811 kg)  SpO2 98% Wt Readings from Last 3 Encounters:  07/08/14 295 lb (133.811 kg)  01/11/14 303 lb (137.44 kg)  11/29/13 314 lb (142.429 kg)    General: Appears his stated age, obese in NAD. Skin: Warm, dry and intact. No rashes, lesions or ulcerations noted. Cardiovascular: Normal rate and rhythm. S1,S2 noted.  No murmur, rubs or gallops noted.  Pulmonary/Chest: Normal effort  and positive vesicular breath sounds. No respiratory distress. No wheezes, rales or ronchi noted.  Musculoskeletal: Left chest wall tender to palpation. Neurological: Alert and oriented.  Psychiatric: Mood and affect normal. Behavior is normal. Judgment and thought content normal.    BMET    Component Value Date/Time   NA 139 03/09/2013 1601   NA 142 01/14/2013 1416   K 4.1 03/09/2013 1601   K 4.1 01/14/2013 1416   CL 105 03/09/2013 1601   CL 109* 01/14/2013 1416   CO2 25 03/09/2013 1601   CO2 27 01/14/2013 1416   GLUCOSE 88 03/09/2013 1601   GLUCOSE 113* 01/14/2013 1416   BUN 17 03/09/2013 1601   BUN 11  01/14/2013 1416   CREATININE 0.76 03/09/2013 1601   CREATININE 0.74 01/14/2013 1416   CALCIUM 9.5 03/09/2013 1601   CALCIUM 9.3 01/14/2013 1416   GFRNONAA >60 01/14/2013 1416   GFRAA >60 01/14/2013 1416    Lipid Panel     Component Value Date/Time   CHOL 171 03/09/2013 1601   TRIG 335* 03/09/2013 1601   HDL 41 03/09/2013 1601   CHOLHDL 4.2 03/09/2013 1601   VLDL 67* 03/09/2013 1601   LDLCALC 63 03/09/2013 1601    CBC    Component Value Date/Time   WBC 8.7 01/11/2014 1540   WBC 7.5 01/14/2013 1416   RBC 5.11 01/11/2014 1540   RBC 5.69 01/14/2013 1416   HGB 15.2 01/11/2014 1540   HGB 15.5 01/14/2013 1416   HCT 44.7 01/11/2014 1540   HCT 48.2 01/14/2013 1416   PLT 215 01/11/2014 1540   PLT 205 01/14/2013 1416   MCV 87.5 01/11/2014 1540   MCV 85 01/14/2013 1416   MCH 29.7 01/11/2014 1540   MCH 27.2 01/14/2013 1416   MCHC 34.0 01/11/2014 1540   MCHC 32.1 01/14/2013 1416   RDW 14.4 01/11/2014 1540   RDW 14.0 01/14/2013 1416   LYMPHSABS 2.6 01/11/2014 1540   MONOABS 0.9 01/11/2014 1540   EOSABS 0.2 01/11/2014 1540   BASOSABS 0.1 01/11/2014 1540    Hgb A1C Lab Results  Component Value Date   HGBA1C 5.9* 03/09/2013         Assessment & Plan:   Left chest wall pain:  ? Rib fracture He wants an xray today Xray of chest ordered, he is too big to get xray of left rib in the office Advised him we do not use rib binders anymore Ibuprofen as needed for pain  RTC in 6 months or sooner if needed

## 2014-07-09 LAB — COMPREHENSIVE METABOLIC PANEL
ALK PHOS: 84 U/L (ref 39–117)
ALT: 22 U/L (ref 0–53)
AST: 19 U/L (ref 0–37)
Albumin: 4.2 g/dL (ref 3.5–5.2)
BUN: 11 mg/dL (ref 6–23)
CO2: 27 mEq/L (ref 19–32)
Calcium: 9.6 mg/dL (ref 8.4–10.5)
Chloride: 106 mEq/L (ref 96–112)
Creatinine, Ser: 0.7 mg/dL (ref 0.40–1.50)
GFR: 137.26 mL/min (ref >60.00)
Glucose, Bld: 113 mg/dL — ABNORMAL HIGH (ref 70–99)
POTASSIUM: 4 meq/L (ref 3.5–5.1)
Sodium: 139 mEq/L (ref 135–145)
Total Bilirubin: 0.3 mg/dL (ref 0.2–1.2)
Total Protein: 7.1 g/dL (ref 6.0–8.3)

## 2014-07-09 LAB — CBC
HCT: 45 % (ref 39.0–52.0)
Hemoglobin: 15.1 g/dL (ref 13.0–17.0)
MCHC: 33.5 g/dL (ref 30.0–36.0)
MCV: 83.6 fl (ref 78.0–100.0)
Platelets: 247 10*3/uL (ref 150.0–400.0)
RBC: 5.38 Mil/uL (ref 4.22–5.81)
RDW: 13.8 % (ref 11.5–15.5)
WBC: 9.9 10*3/uL (ref 4.0–10.5)

## 2014-07-15 ENCOUNTER — Telehealth: Payer: Self-pay

## 2014-07-15 MED ORDER — TRAMADOL HCL 50 MG PO TABS: 50.0000 mg | ORAL_TABLET | Freq: Two times a day (BID) | ORAL | Status: DC | PRN

## 2014-07-15 NOTE — Telephone Encounter (Signed)
Rx called in to pharmacy. Pt is aware.  

## 2014-07-15 NOTE — Addendum Note (Signed)
Addended by: Littie Deeds Y on: 07/15/2014 04:00 PM   Modules accepted: Orders

## 2014-07-15 NOTE — Telephone Encounter (Signed)
Pt left v/m; pt seen on 07/08/14 and rib cage pain is still bothering pt; pain scale now is 8. Pt taking ibuprofen q4h and aleve q 12 h. Pt is off work 07/15/14 and 07/16/14 Pt request cb to see if anything else can do for pain.[facility]

## 2014-07-15 NOTE — Telephone Encounter (Signed)
Mel,  Phone in Tramadol 50 mg PO BID prn # 60 , no refills

## 2014-08-22 ENCOUNTER — Other Ambulatory Visit: Payer: Self-pay | Admitting: Internal Medicine

## 2014-09-26 ENCOUNTER — Encounter: Payer: Self-pay | Admitting: Internal Medicine

## 2014-09-26 ENCOUNTER — Ambulatory Visit (INDEPENDENT_AMBULATORY_CARE_PROVIDER_SITE_OTHER): Payer: BLUE CROSS/BLUE SHIELD | Admitting: Internal Medicine

## 2014-09-26 VITALS — BP 126/82 | HR 73 | Temp 97.7°F | Ht 68.25 in | Wt 294.0 lb

## 2014-09-26 DIAGNOSIS — Z0000 Encounter for general adult medical examination without abnormal findings: Secondary | ICD-10-CM | POA: Diagnosis not present

## 2014-09-26 DIAGNOSIS — Z23 Encounter for immunization: Secondary | ICD-10-CM

## 2014-09-26 DIAGNOSIS — H04202 Unspecified epiphora, left lacrimal gland: Secondary | ICD-10-CM

## 2014-09-26 DIAGNOSIS — M79672 Pain in left foot: Secondary | ICD-10-CM

## 2014-09-26 LAB — COMPREHENSIVE METABOLIC PANEL
ALT: 26 U/L (ref 0–53)
AST: 27 U/L (ref 0–37)
Albumin: 4.4 g/dL (ref 3.5–5.2)
Alkaline Phosphatase: 85 U/L (ref 39–117)
BILIRUBIN TOTAL: 0.4 mg/dL (ref 0.2–1.2)
BUN: 11 mg/dL (ref 6–23)
CO2: 25 meq/L (ref 19–32)
Calcium: 9.2 mg/dL (ref 8.4–10.5)
Chloride: 107 mEq/L (ref 96–112)
Creatinine, Ser: 0.71 mg/dL (ref 0.40–1.50)
GFR: 134.86 mL/min (ref >60.00)
Glucose, Bld: 80 mg/dL (ref 70–99)
Potassium: 4 mEq/L (ref 3.5–5.1)
Sodium: 139 mEq/L (ref 135–145)
Total Protein: 7.2 g/dL (ref 6.0–8.3)

## 2014-09-26 LAB — CBC
HCT: 42.9 % (ref 39.0–52.0)
HEMOGLOBIN: 14.4 g/dL (ref 13.0–17.0)
MCHC: 33.5 g/dL (ref 30.0–36.0)
MCV: 83.5 fl (ref 78.0–100.0)
PLATELETS: 236 10*3/uL (ref 150.0–400.0)
RBC: 5.14 Mil/uL (ref 4.22–5.81)
RDW: 14.2 % (ref 11.5–15.5)
WBC: 9.3 10*3/uL (ref 4.0–10.5)

## 2014-09-26 LAB — HEMOGLOBIN A1C: HEMOGLOBIN A1C: 5.9 % (ref 4.6–6.5)

## 2014-09-26 LAB — LIPID PANEL
CHOLESTEROL: 136 mg/dL (ref 0–200)
HDL: 32 mg/dL — ABNORMAL LOW (ref >39.00)
LDL Cholesterol: 86 mg/dL (ref 0–99)
NonHDL: 104.21
Total CHOL/HDL Ratio: 4
Triglycerides: 91 mg/dL (ref 0.0–149.0)
VLDL: 18.2 mg/dL (ref 0.0–40.0)

## 2014-09-26 LAB — TSH: TSH: 0.87 u[IU]/mL (ref 0.35–4.50)

## 2014-09-26 MED ORDER — TRAMADOL HCL 50 MG PO TABS: 50.0000 mg | ORAL_TABLET | Freq: Two times a day (BID) | ORAL | Status: DC | PRN

## 2014-09-26 NOTE — Progress Notes (Signed)
Subjective:    Patient ID: Anthony Myers, male    DOB: January 20, 1981, 34 y.o.   MRN: [id number]  HPI  Pt presents to the clinic today for his annual exam.  Flu: never  Tetanus: > 10 years ago Dentist: biannually  Diet: He does consume red meat and lean meats. He does like fruits and veggies. He tries to avoid fried foods. He drinks sodas, lemonade and water. Exercise: None  He does c/o left eye watering. This started 2 days ago. He denies blurred vision, eye pain. He denies runny nose, nasal congestion or sore throat. He has no history of allergies. He has not tried anything OTC. He has not had sick contacts.  He also c/o left foot pain. The pain is mainly in his arch. He describes the pain as throbbing. It is worse with weight bearing. He has gotten a compression sock which has helped. He has also taken Tramadol intermittently with good relief. He is on his feet all day at work and he is concerned because it is getting worse.  Review of Systems      Past Medical History  Diagnosis Date  . Depression   . Anxiety   . Insomnia     Current Outpatient Prescriptions  Medication Sig Dispense Refill  . amLODipine (NORVASC) 5 MG tablet Take 1 tablet (5 mg total) by mouth daily. 90 tablet 1  . FLUoxetine (PROZAC) 20 MG capsule TAKE 1 CAPSULE (20 MG TOTAL) BY MOUTH DAILY. 90 capsule 1  . lisinopril (PRINIVIL,ZESTRIL) 20 MG tablet Take 1 tablet (20 mg total) by mouth daily. 90 tablet 1  . traMADol (ULTRAM) 50 MG tablet Take 1 tablet (50 mg total) by mouth 2 (two) times daily as needed. (Patient not taking: Reported on 09/26/2014) 60 tablet 0   No current facility-administered medications for this visit.    Allergies  Allergen Reactions  . Tylenol [Acetaminophen]     Family History  Problem Relation Age of Onset  . Mental illness Mother   . Hypertension Father     Social History   Social History  . Marital Status: Married    Spouse Name: N/A  . Number of Children: N/A  .  Years of Education: N/A   Occupational History  . Not on file.   Social History Main Topics  . Smoking status: Former Games developer  . Smokeless tobacco: Not on file  . Alcohol Use: No  . Drug Use: No  . Sexual Activity: Not on file   Other Topics Concern  . Not on file   Social History Narrative     Constitutional: Denies fever, malaise, fatigue, headache or abrupt weight changes.  HEENT: Denies eye pain, eye redness, ear pain, ringing in the ears, wax buildup, runny nose, nasal congestion, bloody nose, or sore throat. Respiratory: Denies difficulty breathing, shortness of breath, cough or sputum production.   Cardiovascular: Denies chest pain, chest tightness, palpitations or swelling in the hands or feet.  Gastrointestinal: Denies abdominal pain, bloating, constipation, diarrhea or blood in the stool.  GU: Denies urgency, frequency, pain with urination, burning sensation, blood in urine, odor or discharge. Musculoskeletal: Pt reports left foot pain. Denies decrease in range of motion, difficulty with gait, muscle pain or joint pain and swelling.  Skin: Denies redness, rashes, lesions or ulcercations.  Neurological: Denies dizziness, difficulty with memory, difficulty with speech or problems with balance and coordination.  Psych: Denies anxiety, depression, SI/HI.  No other specific complaints in a complete review of systems (  except as listed in HPI above).  Objective:   Physical Exam  BP 126/82 mmHg  Pulse 73  Temp(Src) 97.7 F (36.5 C) (Oral)  Ht 5' 8.25" (1.734 m)  Wt 294 lb (133.358 kg)  BMI 44.35 kg/m2  SpO2 98% Wt Readings from Last 3 Encounters:  09/26/14 294 lb (133.358 kg)  07/08/14 295 lb (133.811 kg)  01/11/14 303 lb (137.44 kg)    General: Appears his stated age, obese in NAD. Skin: Warm, dry and intact. No rashes, lesions or ulcerations noted. HEENT: Head: normal shape and size; Eyes: sclera white, no icterus, conjunctiva pink, PERRLA and EOMs intact; Ears:  Tm's gray and intact, normal light reflex; Throat/Mouth: Teeth present, mucosa pink and moist, no exudate, lesions or ulcerations noted.  Neck:  Neck supple, trachea midline. No masses, lumps or thyromegaly present.  Cardiovascular: Normal rate and rhythm. S1,S2 noted.  No murmur, rubs or gallops noted. No JVD or BLE edema. Pulmonary/Chest: Normal effort and positive vesicular breath sounds. No respiratory distress. No wheezes, rales or ronchi noted.  Abdomen: Soft and nontender. Normal bowel sounds, no bruits noted. No distention or masses noted. Liver, spleen and kidneys non palpable. Musculoskeletal: Strength 5/5 BUE/BLE. No pain with palpation of the arch. No signs of joint swelling. No difficulty with gait.  Neurological: Alert and oriented. Cranial nerves II-XII grossly intact. Coordination normal.  Psychiatric: Mood and affect normal. Behavior is normal. Judgment and thought content normal.     BMET    Component Value Date/Time   NA 139 07/08/2014 1633   NA 142 01/14/2013 1416   K 4.0 07/08/2014 1633   K 4.1 01/14/2013 1416   CL 106 07/08/2014 1633   CL 109* 01/14/2013 1416   CO2 27 07/08/2014 1633   CO2 27 01/14/2013 1416   GLUCOSE 113* 07/08/2014 1633   GLUCOSE 113* 01/14/2013 1416   BUN 11 07/08/2014 1633   BUN 11 01/14/2013 1416   CREATININE 0.70 07/08/2014 1633   CREATININE 0.76 03/09/2013 1601   CREATININE 0.74 01/14/2013 1416   CALCIUM 9.6 07/08/2014 1633   CALCIUM 9.3 01/14/2013 1416   GFRNONAA >60 01/14/2013 1416   GFRAA >60 01/14/2013 1416    Lipid Panel     Component Value Date/Time   CHOL 171 03/09/2013 1601   TRIG 335* 03/09/2013 1601   HDL 41 03/09/2013 1601   CHOLHDL 4.2 03/09/2013 1601   VLDL 67* 03/09/2013 1601   LDLCALC 63 03/09/2013 1601    CBC    Component Value Date/Time   WBC 9.9 07/08/2014 1633   WBC 7.5 01/14/2013 1416   RBC 5.38 07/08/2014 1633   RBC 5.69 01/14/2013 1416   HGB 15.1 07/08/2014 1633   HGB 15.5 01/14/2013 1416   HCT  45.0 07/08/2014 1633   HCT 48.2 01/14/2013 1416   PLT 247.0 07/08/2014 1633   PLT 205 01/14/2013 1416   MCV 83.6 07/08/2014 1633   MCV 85 01/14/2013 1416   MCH 29.7 01/11/2014 1540   MCH 27.2 01/14/2013 1416   MCHC 33.5 07/08/2014 1633   MCHC 32.1 01/14/2013 1416   RDW 13.8 07/08/2014 1633   RDW 14.0 01/14/2013 1416   LYMPHSABS 2.6 01/11/2014 1540   MONOABS 0.9 01/11/2014 1540   EOSABS 0.2 01/11/2014 1540   BASOSABS 0.1 01/11/2014 1540    Hgb A1C Lab Results  Component Value Date   HGBA1C 5.9* 03/09/2013         Assessment & Plan:   Preventative Health Maintenance:  Advised him to  get a flu shot in the fall 2016 Tdap today Encouraged him to work on diet and exercise Encouraged him to continue seeing a dentist at least annually Will check CBC, CMET, TSH, Lipid and A1C  Left eye watering:  Likely allergy Try Visine Allergy OTC  Pain in arch, left foot:  Advised him to get shoe inserts to see if this helps He will continue wearing the compression sock  RTC in 4 months to follow up chronic conditions

## 2014-09-26 NOTE — Progress Notes (Signed)
Pre visit review using our clinic review tool, if applicable. No additional management support is needed unless otherwise documented below in the visit note. 

## 2014-09-26 NOTE — Addendum Note (Signed)
Addended by: Roena Malady on: 09/26/2014 04:44 PM   Modules accepted: Orders

## 2014-09-26 NOTE — Patient Instructions (Signed)

## 2014-09-26 NOTE — Addendum Note (Signed)
Addended by: Roena Malady on: 09/26/2014 03:27 PM   Modules accepted: Orders

## 2014-10-15 ENCOUNTER — Telehealth: Payer: Self-pay

## 2014-10-15 ENCOUNTER — Encounter: Payer: Self-pay | Admitting: Internal Medicine

## 2014-10-15 ENCOUNTER — Ambulatory Visit (INDEPENDENT_AMBULATORY_CARE_PROVIDER_SITE_OTHER): Payer: BLUE CROSS/BLUE SHIELD | Admitting: Internal Medicine

## 2014-10-15 VITALS — BP 120/74 | HR 96 | Temp 100.2°F | Wt 294.0 lb

## 2014-10-15 DIAGNOSIS — J209 Acute bronchitis, unspecified: Secondary | ICD-10-CM | POA: Diagnosis not present

## 2014-10-15 DIAGNOSIS — R52 Pain, unspecified: Secondary | ICD-10-CM

## 2014-10-15 MED ORDER — ALBUTEROL SULFATE HFA 108 (90 BASE) MCG/ACT IN AERS: 2.0000 | INHALATION_SPRAY | Freq: Four times a day (QID) | RESPIRATORY_TRACT | Status: DC | PRN

## 2014-10-15 MED ORDER — AZITHROMYCIN 250 MG PO TABS: ORAL_TABLET | ORAL | Status: DC

## 2014-10-15 MED ORDER — PREDNISONE 10 MG PO TABS: ORAL_TABLET | ORAL | Status: DC

## 2014-10-15 MED ORDER — HYDROCODONE-HOMATROPINE 5-1.5 MG/5ML PO SYRP: 5.0000 mL | ORAL_SOLUTION | Freq: Three times a day (TID) | ORAL | Status: DC | PRN

## 2014-10-15 NOTE — Progress Notes (Signed)
Pre visit review using our clinic review tool, if applicable. No additional management support is needed unless otherwise documented below in the visit note. 

## 2014-10-15 NOTE — Patient Instructions (Signed)

## 2014-10-15 NOTE — Progress Notes (Signed)
HPI  Pt presents to the clinic today with c/o nasal congestion, cough, chest congestion, fever and chills. He woke up this morning not feeling well, but he feels like his symptoms got worse at 11:00. The cough is productive of yellow mucous. He does feel like he is wheezing and short of breath. He has run a fever around 100-101. He has taken Mucinex and Vit C without any relief. He has not history of allergies or breathing problems. He has had sick contacts.  Review of Systems      Past Medical History  Diagnosis Date  . Depression   . Anxiety   . Insomnia     Family History  Problem Relation Age of Onset  . Mental illness Mother   . Hypertension Father     Social History   Social History  . Marital Status: Married    Spouse Name: N/A  . Number of Children: N/A  . Years of Education: N/A   Occupational History  . Not on file.   Social History Main Topics  . Smoking status: Former Games developer  . Smokeless tobacco: Never Used  . Alcohol Use: No  . Drug Use: No  . Sexual Activity: Not on file   Other Topics Concern  . Not on file   Social History Narrative    Allergies  Allergen Reactions  . Tylenol [Acetaminophen]      Constitutional: Positive headache, fatigue and fever. Denies abrupt weight changes.  HEENT:  Positive nasal congestion, sore throat. Denies eye redness, eye pain, pressure behind the eyes, facial pain, ear pain, ringing in the ears, wax buildup, runny nose or bloody nose. Respiratory: Positive cough and shortness of breath. Denies difficulty breathing.  Cardiovascular: Denies chest pain, chest tightness, palpitations or swelling in the hands or feet.   No other specific complaints in a complete review of systems (except as listed in HPI above).  Objective:   BP 120/74 mmHg  Pulse 96  Temp(Src) 100.2 F (37.9 C) (Oral)  Wt 294 lb (133.358 kg)  SpO2 96% Wt Readings from Last 3 Encounters:  10/15/14 294 lb (133.358 kg)  09/26/14 294 lb (133.358  kg)  07/08/14 295 lb (133.811 kg)     General: Appears his stated age, ill appearing in NAD. HEENT: Head: normal shape and size no sinus tenderness noted; Eyes: sclera white, no icterus, conjunctiva pink; Ears: Tm's pink but intact, normal light reflex; Throat/Mouth: Teeth present, mucosa erythematous and moist, no exudate noted, no lesions or ulcerations noted.  Neck: No cervical lymphadenopathy.  Cardiovascular: Normal rate and rhythm. S1,S2 noted.  No murmur, rubs or gallops noted.  Pulmonary/Chest: Normal effort and bilateral expiratory wheezing with scattered rhonchi throughout. No respiratory distress.      Assessment & Plan:   Acute Bronchitis:  RST: neg Get some rest and drink plenty of water Ibuprofen as needed for fever and body aches eRx for Azithromax x 5 days eRx for Pred Taper x 6 days Rx for Hycodan cough syrup  RTC as needed or if symptoms persist.

## 2014-10-15 NOTE — Telephone Encounter (Signed)
Spoke to pt and he states when lays flat on back he definitely feels restriction with breathing but as soon as he elevates it becomes easier---per verbal order by Nicki Reaper if pt has moderately severe SOB to go to ED now, if it is mild send in albuterol inhaler---advised pt to accommodate elevation while laying down and if he feels SOB worsen or more difficulty breathing to go to ED immediately otherwise call me with update on the evening tomorrow after using the inhaler--please advise if no other instruction is needed---inhaler sent to pharmacy

## 2014-10-15 NOTE — Telephone Encounter (Signed)
Pt left v/m;seen earlier and went home and took med but still cannot breathe well and pt wants inhaler. [facility]

## 2014-10-16 LAB — POCT INFLUENZA A/B
INFLUENZA A, POC: NEGATIVE
INFLUENZA B, POC: NEGATIVE

## 2014-10-16 NOTE — Addendum Note (Signed)
Addended by: Roena Malady on: 10/16/2014 11:37 AM   Modules accepted: Orders

## 2014-10-17 ENCOUNTER — Telehealth: Payer: Self-pay | Admitting: Internal Medicine

## 2014-10-17 NOTE — Telephone Encounter (Signed)
Pt is requesting call back please call [phone]

## 2014-10-17 NOTE — Telephone Encounter (Signed)
Great!

## 2014-10-17 NOTE — Telephone Encounter (Signed)
Spoke to pt to get an update on how he was feeling---pt states he feels quite a bit better and breathing has greatly improved with inhaler

## 2015-01-09 ENCOUNTER — Other Ambulatory Visit: Payer: Self-pay | Admitting: Internal Medicine

## 2015-02-17 ENCOUNTER — Other Ambulatory Visit: Payer: Self-pay | Admitting: Internal Medicine

## 2015-03-07 ENCOUNTER — Encounter: Payer: Self-pay | Admitting: Internal Medicine

## 2015-03-07 ENCOUNTER — Ambulatory Visit (INDEPENDENT_AMBULATORY_CARE_PROVIDER_SITE_OTHER): Payer: BLUE CROSS/BLUE SHIELD | Admitting: Internal Medicine

## 2015-03-07 VITALS — BP 132/76 | HR 96 | Temp 98.4°F | Wt 312.0 lb

## 2015-03-07 DIAGNOSIS — J309 Allergic rhinitis, unspecified: Secondary | ICD-10-CM

## 2015-03-07 DIAGNOSIS — Z23 Encounter for immunization: Secondary | ICD-10-CM

## 2015-03-07 DIAGNOSIS — R5383 Other fatigue: Secondary | ICD-10-CM

## 2015-03-07 DIAGNOSIS — F32A Depression, unspecified: Secondary | ICD-10-CM

## 2015-03-07 DIAGNOSIS — I10 Essential (primary) hypertension: Secondary | ICD-10-CM

## 2015-03-07 DIAGNOSIS — F329 Major depressive disorder, single episode, unspecified: Secondary | ICD-10-CM

## 2015-03-07 DIAGNOSIS — G4719 Other hypersomnia: Secondary | ICD-10-CM

## 2015-03-07 MED ORDER — METHYLPREDNISOLONE ACETATE 80 MG/ML IJ SUSP
80.0000 mg | Freq: Once | INTRAMUSCULAR | Status: AC
  Administered 2015-03-07: 80 mg via INTRAMUSCULAR

## 2015-03-07 NOTE — Assessment & Plan Note (Signed)
Chronic but stable on Prozac Support offered today, will continue to monitor

## 2015-03-07 NOTE — Assessment & Plan Note (Signed)
Controlled on Lisinopril and Norvasc No need to repeat labs today ECG today shows left atrial enlargement

## 2015-03-07 NOTE — Addendum Note (Signed)
Addended by: Roena Malady on: 03/07/2015 04:55 PM   Modules accepted: Orders

## 2015-03-07 NOTE — Assessment & Plan Note (Signed)
Encouraged him to work on diet and exercise 

## 2015-03-07 NOTE — Progress Notes (Signed)
Pre visit review using our clinic review tool, if applicable. No additional management support is needed unless otherwise documented below in the visit note. 

## 2015-03-07 NOTE — Progress Notes (Signed)
Subjective:    Patient ID: Anthony Myers, male    DOB: May 26, 1980, 35 y.o.   MRN: [id number]  HPI  Pt presents to the clinic today for follow up of chronic medical conditions.  HTN: BP well controlled on Lisinopril and Norvasc. He denies adverse effects. His BP today is 132/76. There is no ECG in the system for review.  Obesity: He has gained 17 lb since his last visit. His BMI is 47.07. He is currently not working on any diet or exercise regimen.  Depression: Chronic but stable on Prozac. He has tried to wean off it in the past but was unsuccessful.  He also c/o ear fullness, runny nose and sore throat. This started 2 weeks ago. He is blowing clear mucous out of his nose. He does have a cough but it is unproductive. He denies fever, chills, body aches or shortness of breath. He has tried Mucinex, Afrin and Tussin with minimal relief. He denies seasonal allergies. He has had sick contacts.  He also c/o fatigue and excessive daytime sleepiness. This has been going on for years but has gotten worse in the last 6 weeks. He does tell me that his wife says he snores. He has almost fallen asleep while driving multiple times. He feels like taking a nap when he gets home. He only sleep 1-2 hours at a time.  Review of Systems      Past Medical History  Diagnosis Date  . Depression   . Anxiety   . Insomnia     Current Outpatient Prescriptions  Medication Sig Dispense Refill  . amLODipine (NORVASC) 5 MG tablet Take 1 tablet (5 mg total) by mouth daily. 90 tablet 0  . FLUoxetine (PROZAC) 20 MG capsule TAKE 1 CAPSULE (20 MG TOTAL) BY MOUTH DAILY. 90 capsule 1  . lisinopril (PRINIVIL,ZESTRIL) 20 MG tablet Take 1 tablet (20 mg total) by mouth daily. 90 tablet 1   No current facility-administered medications for this visit.    Allergies  Allergen Reactions  . Tylenol [Acetaminophen]     Family History  Problem Relation Age of Onset  . Mental illness Mother   . Hypertension Father      Social History   Social History  . Marital Status: Married    Spouse Name: N/A  . Number of Children: N/A  . Years of Education: N/A   Occupational History  . Not on file.   Social History Main Topics  . Smoking status: Former Games developer  . Smokeless tobacco: Never Used  . Alcohol Use: No  . Drug Use: No  . Sexual Activity: Not on file   Other Topics Concern  . Not on file   Social History Narrative     Constitutional: Pt reports fatigue and weight gain. Denies fever, malaise, headache.  HEENT: Pt reports ear fullness, runny nose and sore throat. Denies nasal congestion. Respiratory: Denies difficulty breathing, shortness of breath, cough or sputum production.   Cardiovascular: Denies chest pain, chest tightness, palpitations or swelling in the hands or feet.  Skin: Denies redness, rashes, lesions or ulcercations.  Neurological: Pt reports insomnia. Denies dizziness, difficulty with memory, difficulty with speech or problems with balance and coordination.  Psych: Pt reports chronic depression. Denies anxiety, SI/HI.  No other specific complaints in a complete review of systems (except as listed in HPI above).  Objective:   Physical Exam  BP 132/76 mmHg  Pulse 96  Temp(Src) 98.4 F (36.9 C) (Oral)  Wt 312 lb (141.522 kg)  SpO2 97% Wt Readings from Last 3 Encounters:  03/07/15 312 lb (141.522 kg)  10/15/14 294 lb (133.358 kg)  09/26/14 294 lb (133.358 kg)    General: Appears his stated age, obese in NAD. Skin: Warm, dry and intact. No rashes, lesions or ulcerations noted. HEENT: Head: normal shape and size, no sinus tenderness. Ears: TM's pink but intact, normal light reflex, + serous effusion on left, perforated on the right; Nose: mucosa boggy and moist; Throat: mucosa erythematous and moist, +PND. No adenopathy noted. Cardiovascular: Normal rate and rhythm. S1,S2 noted.  No murmur, rubs or gallops noted.Pulmonary/Chest: Normal effort and positive vesicular  breath sounds. No respiratory distress. No wheezes, rales or ronchi noted.  Neurological: Alert and oriented.  Psychiatric: Mood and affect normal. Behavior is normal. Judgment and thought content normal.    BMET    Component Value Date/Time   NA 139 09/26/2014 1528   NA 142 01/14/2013 1416   K 4.0 09/26/2014 1528   K 4.1 01/14/2013 1416   CL 107 09/26/2014 1528   CL 109* 01/14/2013 1416   CO2 25 09/26/2014 1528   CO2 27 01/14/2013 1416   GLUCOSE 80 09/26/2014 1528   GLUCOSE 113* 01/14/2013 1416   BUN 11 09/26/2014 1528   BUN 11 01/14/2013 1416   CREATININE 0.71 09/26/2014 1528   CREATININE 0.76 03/09/2013 1601   CREATININE 0.74 01/14/2013 1416   CALCIUM 9.2 09/26/2014 1528   CALCIUM 9.3 01/14/2013 1416   GFRNONAA >60 01/14/2013 1416   GFRAA >60 01/14/2013 1416    Lipid Panel     Component Value Date/Time   CHOL 136 09/26/2014 1528   TRIG 91.0 09/26/2014 1528   HDL 32.00* 09/26/2014 1528   CHOLHDL 4 09/26/2014 1528   VLDL 18.2 09/26/2014 1528   LDLCALC 86 09/26/2014 1528    CBC    Component Value Date/Time   WBC 9.3 09/26/2014 1528   WBC 7.5 01/14/2013 1416   RBC 5.14 09/26/2014 1528   RBC 5.69 01/14/2013 1416   HGB 14.4 09/26/2014 1528   HGB 15.5 01/14/2013 1416   HCT 42.9 09/26/2014 1528   HCT 48.2 01/14/2013 1416   PLT 236.0 09/26/2014 1528   PLT 205 01/14/2013 1416   MCV 83.5 09/26/2014 1528   MCV 85 01/14/2013 1416   MCH 29.7 01/11/2014 1540   MCH 27.2 01/14/2013 1416   MCHC 33.5 09/26/2014 1528   MCHC 32.1 01/14/2013 1416   RDW 14.2 09/26/2014 1528   RDW 14.0 01/14/2013 1416   LYMPHSABS 2.6 01/11/2014 1540   MONOABS 0.9 01/11/2014 1540   EOSABS 0.2 01/11/2014 1540   BASOSABS 0.1 01/11/2014 1540    Hgb A1C Lab Results  Component Value Date   HGBA1C 5.9 09/26/2014         Assessment & Plan:   Allergic Rhinitis:  80 mg Depo IM today Start Zyrtec and Flonase OTC  Excessive daytime sleepiness, fatigue:  He very likely could have  OSA Neck Circumference 18.5 inches Epworth sleepiness scale completed and scanned in chart   RTC in 6 months for annual exam or sooner if needed

## 2015-03-07 NOTE — Patient Instructions (Signed)
Allergic Rhinitis Allergic rhinitis is when the mucous membranes in the nose respond to allergens. Allergens are particles in the air that cause your body to have an allergic reaction. This causes you to release allergic antibodies. Through a chain of events, these eventually cause you to release histamine into the blood stream. Although meant to protect the body, it is this release of histamine that causes your discomfort, such as frequent sneezing, congestion, and an itchy, runny nose.  CAUSES Seasonal allergic rhinitis (hay fever) is caused by pollen allergens that may come from grasses, trees, and weeds. Year-round allergic rhinitis (perennial allergic rhinitis) is caused by allergens such as house dust mites, pet dander, and mold spores. SYMPTOMS  Nasal stuffiness (congestion).  Itchy, runny nose with sneezing and tearing of the eyes. DIAGNOSIS Your health care provider can help you determine the allergen or allergens that trigger your symptoms. If you and your health care provider are unable to determine the allergen, skin or blood testing may be used. Your health care provider will diagnose your condition after taking your health history and performing a physical exam. Your health care provider may assess you for other related conditions, such as asthma, pink eye, or an ear infection. TREATMENT Allergic rhinitis does not have a cure, but it can be controlled by:  Medicines that block allergy symptoms. These may include allergy shots, nasal sprays, and oral antihistamines.  Avoiding the allergen. Hay fever may often be treated with antihistamines in pill or nasal spray forms. Antihistamines block the effects of histamine. There are over-the-counter medicines that may help with nasal congestion and swelling around the eyes. Check with your health care provider before taking or giving this medicine. If avoiding the allergen or the medicine prescribed do not work, there are many new medicines  your health care provider can prescribe. Stronger medicine may be used if initial measures are ineffective. Desensitizing injections can be used if medicine and avoidance does not work. Desensitization is when a patient is given ongoing shots until the body becomes less sensitive to the allergen. Make sure you follow up with your health care provider if problems continue. HOME CARE INSTRUCTIONS It is not possible to completely avoid allergens, but you can reduce your symptoms by taking steps to limit your exposure to them. It helps to know exactly what you are allergic to so that you can avoid your specific triggers. SEEK MEDICAL CARE IF:  You have a fever.  You develop a cough that does not stop easily (persistent).  You have shortness of breath.  You start wheezing.  Symptoms interfere with normal daily activities.   This information is not intended to replace advice given to you by your health care provider. Make sure you discuss any questions you have with your health care provider.   Document Released: 10/06/2000 Document Revised: 02/01/2014 Document Reviewed: 09/18/2012 Elsevier Interactive Patient Education 2016 [employer]  

## 2015-03-19 ENCOUNTER — Encounter: Payer: Self-pay | Admitting: Internal Medicine

## 2015-03-19 ENCOUNTER — Ambulatory Visit (INDEPENDENT_AMBULATORY_CARE_PROVIDER_SITE_OTHER): Payer: BLUE CROSS/BLUE SHIELD | Admitting: Internal Medicine

## 2015-03-19 VITALS — BP 144/90 | HR 91 | Temp 98.0°F | Wt 316.0 lb

## 2015-03-19 DIAGNOSIS — J0100 Acute maxillary sinusitis, unspecified: Secondary | ICD-10-CM | POA: Diagnosis not present

## 2015-03-19 MED ORDER — AMOXICILLIN-POT CLAVULANATE 875-125 MG PO TABS: 1.0000 | ORAL_TABLET | Freq: Two times a day (BID) | ORAL | Status: DC

## 2015-03-19 MED ORDER — PREDNISONE 10 MG PO TABS: ORAL_TABLET | ORAL | Status: DC

## 2015-03-19 MED ORDER — HYDROCODONE-HOMATROPINE 5-1.5 MG/5ML PO SYRP: 5.0000 mL | ORAL_SOLUTION | Freq: Three times a day (TID) | ORAL | Status: DC | PRN

## 2015-03-19 MED ORDER — LISINOPRIL 20 MG PO TABS: 20.0000 mg | ORAL_TABLET | Freq: Every day | ORAL | Status: DC

## 2015-03-19 NOTE — Patient Instructions (Signed)

## 2015-03-19 NOTE — Progress Notes (Signed)
Pre visit review using our clinic review tool, if applicable. No additional management support is needed unless otherwise documented below in the visit note. 

## 2015-03-19 NOTE — Progress Notes (Signed)
HPI  Pt presents to the clinic today with c/o ear fullness, runny nose and sore throat. This has been going on for 2 weeks. He is blowing clear mucous out of his nose. He describes ear pressure, not pain. He denies decreased hearing. He reports he did feel like his ears were draining last night. He denies difficulty swallowing. He denies fever, chills or body aches. He has tried Zyrtec, Flonase, Tussin and Mucinex with minimal relief. He has no history of seasonal allergies. He has not had sick contacts.  Review of Systems    Past Medical History  Diagnosis Date  . Depression   . Anxiety   . Insomnia     Family History  Problem Relation Age of Onset  . Mental illness Mother   . Hypertension Father     Social History   Social History  . Marital Status: Married    Spouse Name: N/A  . Number of Children: N/A  . Years of Education: N/A   Occupational History  . Not on file.   Social History Main Topics  . Smoking status: Former Games developer  . Smokeless tobacco: Never Used  . Alcohol Use: No  . Drug Use: No  . Sexual Activity: Not on file   Other Topics Concern  . Not on file   Social History Narrative    Allergies  Allergen Reactions  . Tylenol [Acetaminophen]      Constitutional:  Denies headache, fatigue, fever or abrupt weight changes.  HEENT:  Positive ear fullness, runny nose, nasal congestion and sore throat. Denies eye redness, ear pain, ringing in the ears, wax buildup, or bloody nose. Respiratory: Positive cough. Denies difficulty breathing or shortness of breath.  Cardiovascular: Denies chest pain, chest tightness, palpitations or swelling in the hands or feet.   No other specific complaints in a complete review of systems (except as listed in HPI above).  Objective:  BP 144/90 mmHg  Pulse 91  Temp(Src) 98 F (36.7 C) (Oral)  Wt 316 lb (143.337 kg)  SpO2 97%   General: Appears his stated age, obese, ill appearing in NAD. Skin: Warm, dry and intact.  No rashes, lesions or ulcerations noted. HEENT: Head: normal shape and size, maxillary sinus tenderness. Ears: TM's pink but intact, normal light reflex, + serous effusion on left, perforated on the right; Nose: mucosa boggy and moist; Throat: mucosa erythematous and moist, +PND. No adenopathy noted. Cardiovascular: Normal rate and rhythm. S1,S2 noted.  No murmur, rubs or gallops noted. Pulmonary/Chest: Normal effort and positive vesicular breath sounds. No respiratory distress. No wheezes, rales or ronchi noted.        Assessment & Plan:   Acute bacterial sinusitis  Can use a Neti Pot which can be purchased from your local drug store. Flonase 2 sprays each nostril for 3 days and then as needed. Augmentin BID for 10 days Pred Taper x 6 days RX for Hycodan for cough Work note provided  RTC as needed or if symptoms persist.

## 2015-03-25 ENCOUNTER — Ambulatory Visit (INDEPENDENT_AMBULATORY_CARE_PROVIDER_SITE_OTHER): Payer: BLUE CROSS/BLUE SHIELD | Admitting: Internal Medicine

## 2015-03-25 ENCOUNTER — Encounter: Payer: Self-pay | Admitting: Internal Medicine

## 2015-03-25 VITALS — BP 138/90 | HR 82 | Temp 98.2°F | Wt 312.0 lb

## 2015-03-25 DIAGNOSIS — R0602 Shortness of breath: Secondary | ICD-10-CM | POA: Diagnosis not present

## 2015-03-25 DIAGNOSIS — R062 Wheezing: Secondary | ICD-10-CM

## 2015-03-25 DIAGNOSIS — R059 Cough, unspecified: Secondary | ICD-10-CM

## 2015-03-25 DIAGNOSIS — R0789 Other chest pain: Secondary | ICD-10-CM

## 2015-03-25 DIAGNOSIS — J0100 Acute maxillary sinusitis, unspecified: Secondary | ICD-10-CM

## 2015-03-25 DIAGNOSIS — R05 Cough: Secondary | ICD-10-CM

## 2015-03-25 MED ORDER — HYDROCODONE-HOMATROPINE 5-1.5 MG/5ML PO SYRP: 5.0000 mL | ORAL_SOLUTION | Freq: Three times a day (TID) | ORAL | Status: DC | PRN

## 2015-03-25 MED ORDER — ALBUTEROL SULFATE HFA 108 (90 BASE) MCG/ACT IN AERS: 2.0000 | INHALATION_SPRAY | Freq: Four times a day (QID) | RESPIRATORY_TRACT | Status: DC | PRN

## 2015-03-25 MED ORDER — ALBUTEROL SULFATE (2.5 MG/3ML) 0.083% IN NEBU
2.5000 mg | INHALATION_SOLUTION | Freq: Once | RESPIRATORY_TRACT | Status: AC
  Administered 2015-03-25: 2.5 mg via RESPIRATORY_TRACT

## 2015-03-25 MED ORDER — IPRATROPIUM BROMIDE 0.02 % IN SOLN
0.5000 mg | Freq: Once | RESPIRATORY_TRACT | Status: AC
  Administered 2015-03-25: 0.5 mg via RESPIRATORY_TRACT

## 2015-03-25 NOTE — Progress Notes (Signed)
Subjective:    Patient ID: Anthony Myers, male    DOB: Dec 03, 1980, 35 y.o.   MRN: [id number]  HPI  Pt presents to the clinic today to follow up sinusitis. He was seen 03/19/15 for the same. He was treated with a 10 day course of Augmentin and 6 days of Prednisone. He feels like the mucous has drained from his head into his chest. He reports he noticed some chest tightness and worsening cough over the weekend. The cough is productive of yellow mucous. He is slightly more short of breath. He has not run any fevers that he is aware of. He took his last Prednisone today and still has a few more days left on his Augmentin.  Review of Systems      Past Medical History  Diagnosis Date  . Depression   . Anxiety   . Insomnia     Current Outpatient Prescriptions  Medication Sig Dispense Refill  . amLODipine (NORVASC) 5 MG tablet Take 1 tablet (5 mg total) by mouth daily. 90 tablet 0  . amoxicillin-clavulanate (AUGMENTIN) 875-125 MG tablet Take 1 tablet by mouth 2 (two) times daily. 20 tablet 0  . FLUoxetine (PROZAC) 20 MG capsule TAKE 1 CAPSULE (20 MG TOTAL) BY MOUTH DAILY. 90 capsule 1  . fluticasone (FLONASE) 50 MCG/ACT nasal spray Place 2 sprays into both nostrils daily.    Marland Kitchen HYDROcodone-homatropine (HYCODAN) 5-1.5 MG/5ML syrup Take 5 mLs by mouth every 8 (eight) hours as needed for cough. 120 mL 0  . lisinopril (PRINIVIL,ZESTRIL) 20 MG tablet Take 1 tablet (20 mg total) by mouth daily. 90 tablet 1  . predniSONE (DELTASONE) 10 MG tablet Take 3 tabs on days 1-2, take 2 tabs on days 3-4, take 1 tab on days 5-6 12 tablet 0   No current facility-administered medications for this visit.    Allergies  Allergen Reactions  . Tylenol [Acetaminophen]     Family History  Problem Relation Age of Onset  . Mental illness Mother   . Hypertension Father     Social History   Social History  . Marital Status: Married    Spouse Name: N/A  . Number of Children: N/A  . Years of Education: N/A    Occupational History  . Not on file.   Social History Main Topics  . Smoking status: Former Games developer  . Smokeless tobacco: Never Used  . Alcohol Use: No  . Drug Use: No  . Sexual Activity: Not on file   Other Topics Concern  . Not on file   Social History Narrative     Constitutional: Pt reports malaise. Denies fever, malaise, fatigue, headache or abrupt weight changes.  HEENT: Pt reports nasal congestion and sore throat. Denies eye pain, eye redness, ear pain, ringing in the ears, wax buildup, runny nose, bloody noset. Respiratory: Pt reports cough and shortness of breath. Denies difficulty breathing.   Cardiovascular: Pt reports chest tightness. Denies chest pain, palpitations or swelling in the hands or feet.   No other specific complaints in a complete review of systems (except as listed in HPI above).  Objective:   Physical Exam   BP 138/90 mmHg  Pulse 82  Temp(Src) 98.2 F (36.8 C) (Oral)  Wt 312 lb (141.522 kg)  SpO2 97% Wt Readings from Last 3 Encounters:  03/25/15 312 lb (141.522 kg)  03/19/15 316 lb (143.337 kg)  03/07/15 312 lb (141.522 kg)    General: Appears his stated age, obese in NAD. HEENT: Head: normal shape  and size, no sinus tenderness noted; Throat/Mouth: Teeth present, mucosa pink and moist, no exudate, lesions or ulcerations noted.  Neck:  No adenopathy noted. Cardiovascular: Normal rate and rhythm. S1,S2 noted.  No murmur, rubs or gallops noted. Pulmonary/Chest: Normal effort and bilateral expiratory wheezing. No respiratory distress.    BMET    Component Value Date/Time   NA 139 09/26/2014 1528   NA 142 01/14/2013 1416   K 4.0 09/26/2014 1528   K 4.1 01/14/2013 1416   CL 107 09/26/2014 1528   CL 109* 01/14/2013 1416   CO2 25 09/26/2014 1528   CO2 27 01/14/2013 1416   GLUCOSE 80 09/26/2014 1528   GLUCOSE 113* 01/14/2013 1416   BUN 11 09/26/2014 1528   BUN 11 01/14/2013 1416   CREATININE 0.71 09/26/2014 1528   CREATININE 0.76  03/09/2013 1601   CREATININE 0.74 01/14/2013 1416   CALCIUM 9.2 09/26/2014 1528   CALCIUM 9.3 01/14/2013 1416   GFRNONAA >60 01/14/2013 1416   GFRAA >60 01/14/2013 1416    Lipid Panel     Component Value Date/Time   CHOL 136 09/26/2014 1528   TRIG 91.0 09/26/2014 1528   HDL 32.00* 09/26/2014 1528   CHOLHDL 4 09/26/2014 1528   VLDL 18.2 09/26/2014 1528   LDLCALC 86 09/26/2014 1528    CBC    Component Value Date/Time   WBC 9.3 09/26/2014 1528   WBC 7.5 01/14/2013 1416   RBC 5.14 09/26/2014 1528   RBC 5.69 01/14/2013 1416   HGB 14.4 09/26/2014 1528   HGB 15.5 01/14/2013 1416   HCT 42.9 09/26/2014 1528   HCT 48.2 01/14/2013 1416   PLT 236.0 09/26/2014 1528   PLT 205 01/14/2013 1416   MCV 83.5 09/26/2014 1528   MCV 85 01/14/2013 1416   MCH 29.7 01/11/2014 1540   MCH 27.2 01/14/2013 1416   MCHC 33.5 09/26/2014 1528   MCHC 32.1 01/14/2013 1416   RDW 14.2 09/26/2014 1528   RDW 14.0 01/14/2013 1416   LYMPHSABS 2.6 01/11/2014 1540   MONOABS 0.9 01/11/2014 1540   EOSABS 0.2 01/11/2014 1540   BASOSABS 0.1 01/11/2014 1540    Hgb A1C Lab Results  Component Value Date   HGBA1C 5.9 09/26/2014       Assessment & Plan:   Acute sinusitis:  Seems to be improving but draining into the chest Finish out Augmentin Continue Flonase  Cough, chest tightness, shortness of breath and wheezing:  Albuterol/Ipatropium neb treatment in office today eRx for Albuterol inhaler RX for Hycodan for cough  RTC as needed or if symptoms persist or worsen

## 2015-03-25 NOTE — Progress Notes (Signed)
Pre visit review using our clinic review tool, if applicable. No additional management support is needed unless otherwise documented below in the visit note. 

## 2015-03-25 NOTE — Patient Instructions (Signed)
Bronchospasm, Adult A bronchospasm is when the tubes that carry air in and out of your lungs (airways) spasm or tighten. During a bronchospasm it is hard to breathe. This is because the airways get smaller. A bronchospasm can be triggered by:  Allergies. These may be to animals, pollen, food, or mold.  Infection. This is a common cause of bronchospasm.  Exercise.  Irritants. These include pollution, cigarette smoke, strong odors, aerosol sprays, and paint fumes.  Weather changes.  Stress.  Being emotional. HOME CARE   Always have a plan for getting help. Know when to call your doctor and local emergency services (911 in the [place] Know where you can get emergency care.  Only take medicines as told by your doctor.  If you were prescribed an inhaler or nebulizer machine, ask your doctor how to use it correctly. Always use a spacer with your inhaler if you were given one.  Stay calm during an attack. Try to relax and breathe more slowly.  Control your home environment:  Change your heating and air conditioning filter at least once a month.  Limit your use of fireplaces and wood stoves.  Do not  smoke. Do not  allow smoking in your home.  Avoid perfumes and fragrances.  Get rid of pests (such as roaches and mice) and their droppings.  Throw away plants if you see mold on them.  Keep your house clean and dust free.  Replace carpet with wood, tile, or vinyl flooring. Carpet can trap dander and dust.  Use allergy-proof pillows, mattress covers, and box spring covers.  Wash bed sheets and blankets every week in hot water. Dry them in a dryer.  Use blankets that are made of polyester or cotton.  Wash hands frequently. GET HELP IF:  You have muscle aches.  You have chest pain.  The thick spit you spit or cough up (sputum) changes from clear or white to yellow, green, gray, or bloody.  The thick spit you spit or cough up gets thicker.  There are problems that may be  related to the medicine you are given such as:  A rash.  Itching.  Swelling.  Trouble breathing. GET HELP RIGHT AWAY IF:  You feel you cannot breathe or catch your breath.  You cannot stop coughing.  Your treatment is not helping you breathe better.  You have very bad chest pain. MAKE SURE YOU:   Understand these instructions.  Will watch your condition.  Will get help right away if you are not doing well or get worse.   This information is not intended to replace advice given to you by your health care provider. Make sure you discuss any questions you have with your health care provider.   Document Released: 11/08/2008 Document Revised: 02/01/2014 Document Reviewed: 07/04/2012 Elsevier Interactive Patient Education 2016 Elsevier Inc.  

## 2015-04-08 ENCOUNTER — Institutional Professional Consult (permissible substitution): Payer: BLUE CROSS/BLUE SHIELD | Admitting: Pulmonary Disease

## 2015-05-20 ENCOUNTER — Other Ambulatory Visit: Payer: Self-pay | Admitting: Internal Medicine

## 2015-05-21 ENCOUNTER — Ambulatory Visit: Payer: BLUE CROSS/BLUE SHIELD | Admitting: Internal Medicine

## 2015-05-26 ENCOUNTER — Institutional Professional Consult (permissible substitution): Payer: BLUE CROSS/BLUE SHIELD | Admitting: Pulmonary Disease

## 2015-09-03 ENCOUNTER — Telehealth: Payer: Self-pay | Admitting: Internal Medicine

## 2015-09-03 NOTE — Telephone Encounter (Signed)
Patient Name: Anthony BarriosJEREMY Myers  DOB: 02/28/1980    Initial Comment Caller states he has a pain in back and knees. He is having trouble standing up and bending them.   Nurse Assessment  Nurse: Sherilyn CooterHenry, RN, Thurmond ButtsWade Date/Time Lamount Cohen(Eastern Time): 09/03/2015 2:05:23 PM  Confirm and document reason for call. If symptomatic, describe symptoms. You must click the next button to save text entered. ---Caller states that he has low back pain and bilateral knee pain beginning this morning. Denies injury. He states that he has trouble standing up and bending his knees. He rates his pain as 7 on 0-10 scale with certain movements in his knees. His knees hurt worse than his back.  Has the patient traveled out of the country within the last 30 days? ---No  Does the patient have any new or worsening symptoms? ---Yes  Will a triage be completed? ---Yes  Related visit to physician within the last 2 weeks? ---No  Does the PT have any chronic conditions? (i.e. diabetes, asthma, etc.) ---Yes  List chronic conditions. ---HTN  Is this a behavioral health or substance abuse call? ---No     Guidelines    Guideline Title Affirmed Question Affirmed Notes  Knee Pain [1] Redness of the skin AND [2] no fever    Final Disposition User   See Physician within 24 Hours Sherilyn CooterHenry, RN, Thurmond ButtsWade    Referrals  REFERRED TO PCP OFFICE   Disagree/Comply: Danella Maiersomply

## 2015-09-04 ENCOUNTER — Ambulatory Visit (INDEPENDENT_AMBULATORY_CARE_PROVIDER_SITE_OTHER): Payer: BLUE CROSS/BLUE SHIELD | Admitting: Internal Medicine

## 2015-09-04 ENCOUNTER — Encounter: Payer: Self-pay | Admitting: Internal Medicine

## 2015-09-04 VITALS — BP 152/94 | HR 90 | Temp 97.5°F | Wt 333.0 lb

## 2015-09-04 DIAGNOSIS — F329 Major depressive disorder, single episode, unspecified: Secondary | ICD-10-CM

## 2015-09-04 DIAGNOSIS — M25562 Pain in left knee: Secondary | ICD-10-CM

## 2015-09-04 DIAGNOSIS — F32A Depression, unspecified: Secondary | ICD-10-CM

## 2015-09-04 DIAGNOSIS — I10 Essential (primary) hypertension: Secondary | ICD-10-CM | POA: Diagnosis not present

## 2015-09-04 DIAGNOSIS — M25561 Pain in right knee: Secondary | ICD-10-CM

## 2015-09-04 MED ORDER — LISINOPRIL 20 MG PO TABS: 20.0000 mg | ORAL_TABLET | Freq: Every day | ORAL | 0 refills | Status: DC

## 2015-09-04 MED ORDER — NAPROXEN 500 MG PO TABS
500.0000 mg | ORAL_TABLET | Freq: Two times a day (BID) | ORAL | 0 refills | Status: DC
Start: 2015-09-04 — End: 2016-02-19

## 2015-09-04 MED ORDER — FLUOXETINE HCL 20 MG PO CAPS: 20.0000 mg | ORAL_CAPSULE | Freq: Every day | ORAL | 0 refills | Status: DC

## 2015-09-04 NOTE — Progress Notes (Signed)
Subjective:    Patient ID: Anthony Myers, male    DOB: March 17, 1980, 35 y.o.   MRN: [id number]  HPI  Pt presents to the clinic today with a complaint of bilateral knee pain x 2 days.  He reports the pain is located across the front of the knee and in the posterior knee as well.  He describes the pain as sharp with bending the knee and achy/burning with knee extension, and rates the pain at a severity of 8/10.  He reports the pain worsens with squatting and standing.  He admits to mild swelling in the left knee and stiffness, and denies decreased range of motion, redness, or warmth to the touch.   He states he has numbness and tingling in the knees when staying still for an extended period of time which improves with activity.  He also complains of pain at the top of his right buttock near the gluteal crease.  He describes it as sharp with certain movements at a severity of 6/10.  He has tried rest and Naproxen with some relief.  He reports this has never happened before, and denies trauma or injury to the knees.  He states he does a lot of bending, lifting, and kneeling for work.  BP today is 152/94.  He reports he has not yet taken his BP medication today, and usually takes it at night because it makes him drowsy.  He also needs a refill of his Prozac.  Review of Systems Past Medical History:  Diagnosis Date  . Anxiety   . Depression   . Insomnia    Past Surgical History:  Procedure Laterality Date  . TONSILLECTOMY     Family History  Problem Relation Age of Onset  . Mental illness Mother   . Hypertension Father    Current Outpatient Prescriptions on File Prior to Visit  Medication Sig Dispense Refill  . albuterol (PROVENTIL HFA;VENTOLIN HFA) 108 (90 Base) MCG/ACT inhaler Inhale 2 puffs into the lungs every 6 (six) hours as needed for wheezing or shortness of breath. (Patient not taking: Reported on 09/04/2015) 1 Inhaler 0  . fluticasone (FLONASE) 50 MCG/ACT nasal spray Place 2 sprays  into both nostrils daily.     No current facility-administered medications on file prior to visit.    Allergies  Allergen Reactions  . Tylenol [Acetaminophen]     ASSESSMENT: Skin: Denies rashes or redness. MSK: Pt reports bilateral knee pain and pain in right buttock, stiffness, and mild swelling.  Denies decreased range of motion. Neuro: Pt reports numbness and tingling.     Objective:   Physical Exam  BP (!) 152/94   Pulse 90   Temp 97.5 F (36.4 C) (Oral)   Wt (!) 333 lb (151 kg)   SpO2 97%   BMI 50.26 kg/m   General: Well-appearing, obese, in no acute distress. MSK: Lumbar spine, paraspinals, and gluteal muscles nontender to palpation.  Full AROM lumbar spine, pain in right buttock with lumbar flexion, lateral flexion, and rotation.  Full AROM and strength bilateral hips.  No knee swelling noted.  Knees tender to palpation along joint line and in center of posterior knee bilaterally.  Full AROM bilateral knees.  Strength 5/5 BLE. Neuro: Gait normal. Sensation intact to light touch bilateral lower extremities.      Assessment & Plan:   Bilateral knee pain, right gluteal pain:  eRx for Naproxen  BID with meals as needed for pain Continue rest Try ice Knee exercises provided Discussed weight  loss Follow-up on Monday if persist or worsen, will consider xray of knees.  RTC as needed or if symptoms persist or worsen Holiday Mcmenamin, NP

## 2015-09-04 NOTE — Assessment & Plan Note (Addendum)
Uncontrolled on Lisinopril Will consider changing medication but in pain today so will defer to a later visit Refill for Lisinopril Encouraged weight loss

## 2015-09-04 NOTE — Assessment & Plan Note (Addendum)
Encouraged healthy diet and regular exercise for weight loss

## 2015-09-04 NOTE — Assessment & Plan Note (Signed)
Refilled Prozac Call if symptoms worsen

## 2015-09-04 NOTE — Patient Instructions (Signed)
Generic Knee Exercises EXERCISES RANGE OF MOTION (ROM) AND STRETCHING EXERCISES These exercises may help you when beginning to rehabilitate your injury. Your symptoms may resolve with or without further involvement from your physician, physical therapist, or athletic trainer. While completing these exercises, remember:   Restoring tissue flexibility helps normal motion to return to the joints. This allows healthier, less painful movement and activity.  An effective stretch should be held for at least 30 seconds.  A stretch should never be painful. You should only feel a gentle lengthening or release in the stretched tissue. STRETCH - Knee Extension, Prone  Lie on your stomach on a firm surface, such as a bed or countertop. Place your right / left knee and leg just beyond the edge of the surface. You may wish to place a towel under the far end of your right / left thigh for comfort.  Relax your leg muscles and allow gravity to straighten your knee. Your clinician may advise you to add an ankle weight if more resistance is helpful for you.  You should feel a stretch in the back of your right / left knee. Hold this position for __________ seconds. Repeat __________ times. Complete this stretch __________ times per day. * Your physician, physical therapist, or athletic trainer may ask you to add ankle weight to enhance your stretch.  RANGE OF MOTION - Knee Flexion, Active  Lie on your back with both knees straight. (If this causes back discomfort, bend your opposite knee, placing your foot flat on the floor.)  Slowly slide your heel back toward your buttocks until you feel a gentle stretch in the front of your knee or thigh.  Hold for __________ seconds. Slowly slide your heel back to the starting position. Repeat __________ times. Complete this exercise __________ times per day.  STRETCH - Quadriceps, Prone   Lie on your stomach on a firm surface, such as a bed or padded floor.  Bend your  right / left knee and grasp your ankle. If you are unable to reach your ankle or pant leg, use a belt around your foot to lengthen your reach.  Gently pull your heel toward your buttocks. Your knee should not slide out to the side. You should feel a stretch in the front of your thigh and/or knee.  Hold this position for __________ seconds. Repeat __________ times. Complete this stretch __________ times per day.  STRETCH - Hamstrings, Supine   Lie on your back. Loop a belt or towel over the ball of your right / left foot.  Straighten your right / left knee and slowly pull on the belt to raise your leg. Do not allow the right / left knee to bend. Keep your opposite leg flat on the floor.  Raise the leg until you feel a gentle stretch behind your right / left knee or thigh. Hold this position for __________ seconds. Repeat __________ times. Complete this stretch __________ times per day.  STRENGTHENING EXERCISES These exercises may help you when beginning to rehabilitate your injury. They may resolve your symptoms with or without further involvement from your physician, physical therapist, or athletic trainer. While completing these exercises, remember:   Muscles can gain both the endurance and the strength needed for everyday activities through controlled exercises.  Complete these exercises as instructed by your physician, physical therapist, or athletic trainer. Progress the resistance and repetitions only as guided.  You may experience muscle soreness or fatigue, but the pain or discomfort you are trying to   eliminate should never worsen during these exercises. If this pain does worsen, stop and make certain you are following the directions exactly. If the pain is still present after adjustments, discontinue the exercise until you can discuss the trouble with your clinician. STRENGTH - Quadriceps, Isometrics  Lie on your back with your right / left leg extended and your opposite knee  bent.  Gradually tense the muscles in the front of your right / left thigh. You should see either your knee cap slide up toward your hip or increased dimpling just above the knee. This motion will push the back of the knee down toward the floor/mat/bed on which you are lying.  Hold the muscle as tight as you can without increasing your pain for __________ seconds.  Relax the muscles slowly and completely in between each repetition. Repeat __________ times. Complete this exercise __________ times per day.  STRENGTH - Quadriceps, Short Arcs   Lie on your back. Place a __________ inch towel roll under your knee so that the knee slightly bends.  Raise only your lower leg by tightening the muscles in the front of your thigh. Do not allow your thigh to rise.  Hold this position for __________ seconds. Repeat __________ times. Complete this exercise __________ times per day.  OPTIONAL ANKLE WEIGHTS: Begin with ____________________, but DO NOT exceed ____________________. Increase in 1 pound/0.5 kilogram increments.  STRENGTH - Quadriceps, Straight Leg Raises  Quality counts! Watch for signs that the quadriceps muscle is working to insure you are strengthening the correct muscles and not "cheating" by substituting with healthier muscles.  Lay on your back with your right / left leg extended and your opposite knee bent.  Tense the muscles in the front of your right / left thigh. You should see either your knee cap slide up or increased dimpling just above the knee. Your thigh may even quiver.  Tighten these muscles even more and raise your leg 4 to 6 inches off the floor. Hold for __________ seconds.  Keeping these muscles tense, lower your leg.  Relax the muscles slowly and completely in between each repetition. Repeat __________ times. Complete this exercise __________ times per day.  STRENGTH - Hamstring, Curls  Lay on your stomach with your legs extended. (If you lay on a bed, your feet  may hang over the edge.)  Tighten the muscles in the back of your thigh to bend your right / left knee up to 90 degrees. Keep your hips flat on the bed/floor.  Hold this position for __________ seconds.  Slowly lower your leg back to the starting position. Repeat __________ times. Complete this exercise __________ times per day.  OPTIONAL ANKLE WEIGHTS: Begin with ____________________, but DO NOT exceed ____________________. Increase in 1 pound/0.5 kilogram increments.  STRENGTH - Quadriceps, Squats  Stand in a door frame so that your feet and knees are in line with the frame.  Use your hands for balance, not support, on the frame.  Slowly lower your weight, bending at the hips and knees. Keep your lower legs upright so that they are parallel with the door frame. Squat only within the range that does not increase your knee pain. Never let your hips drop below your knees.  Slowly return upright, pushing with your legs, not pulling with your hands. Repeat __________ times. Complete this exercise __________ times per day.  STRENGTH - Quadriceps, Wall Slides  Follow guidelines for form closely. Increased knee pain often results from poorly placed feet or knees.    Lean against a smooth wall or door and walk your feet out 18-24 inches. Place your feet hip-width apart.  Slowly slide down the wall or door until your knees bend __________ degrees.* Keep your knees over your heels, not your toes, and in line with your hips, not falling to either side.  Hold for __________ seconds. Stand up to rest for __________ seconds in between each repetition. Repeat __________ times. Complete this exercise __________ times per day. * Your physician, physical therapist, or athletic trainer will alter this angle based on your symptoms and progress.   This information is not intended to replace advice given to you by your health care provider. Make sure you discuss any questions you have with your health care  provider.   Document Released: 11/25/2004 Document Revised: 02/01/2014 Document Reviewed: 04/25/2008 Elsevier Interactive Patient Education 2016 Elsevier Inc.  

## 2015-09-09 ENCOUNTER — Encounter: Payer: Self-pay | Admitting: Internal Medicine

## 2015-09-09 ENCOUNTER — Ambulatory Visit (INDEPENDENT_AMBULATORY_CARE_PROVIDER_SITE_OTHER): Payer: BLUE CROSS/BLUE SHIELD | Admitting: Internal Medicine

## 2015-09-09 VITALS — BP 158/98 | HR 89 | Temp 98.5°F | Wt 327.0 lb

## 2015-09-09 DIAGNOSIS — M545 Low back pain, unspecified: Secondary | ICD-10-CM

## 2015-09-09 MED ORDER — HYDROCODONE-IBUPROFEN 7.5-200 MG PO TABS: 1.0000 | ORAL_TABLET | Freq: Three times a day (TID) | ORAL | 0 refills | Status: DC | PRN

## 2015-09-09 MED ORDER — HYDROCODONE-IBUPROFEN 5-200 MG PO TABS: 1.0000 | ORAL_TABLET | Freq: Three times a day (TID) | ORAL | 0 refills | Status: DC | PRN

## 2015-09-09 MED ORDER — CYCLOBENZAPRINE HCL 10 MG PO TABS: 10.0000 mg | ORAL_TABLET | Freq: Three times a day (TID) | ORAL | 0 refills | Status: DC | PRN

## 2015-09-09 NOTE — Patient Instructions (Signed)

## 2015-09-09 NOTE — Progress Notes (Signed)
Subjective:    Patient ID: Anthony Myers, male    DOB: 03/27/1980, 35 y.o.   MRN: [id number]  HPI  Pt presents to the clinic today for follow-up of bilateral knee pain and right gluteal pain.  He was seen on 09/04/15 and prescribed Naproxen 500 mg BID, given knee exercises, and encouraged to ice and rest.  Today he reports improvement of the knee pain.  He complains of right lower back pain that started this morning after sneezing while reaching over to turn the knob to start the shower.  He reports he heard a "pop" and the pain started immediately.  He describes the pain as constant sharp/tense/tight pain with no radiation at a severity of 10/10.  He states the current pain feels different and is located higher than the gluteal pain he was previously feeling.  He reports the pain worsens with coughing or movement.  He denies numbness or tingling, bowel or bladder incontinence, or saddle anesthesia.  He has tried lying down on a cool gel pillow with some relief.     Review of Systems Past Medical History:  Diagnosis Date  . Anxiety   . Depression   . Insomnia    Past Surgical History:  Procedure Laterality Date  . TONSILLECTOMY     Family History  Problem Relation Age of Onset  . Mental illness Mother   . Hypertension Father    Current Outpatient Prescriptions on File Prior to Visit  Medication Sig Dispense Refill  . albuterol (PROVENTIL HFA;VENTOLIN HFA) 108 (90 Base) MCG/ACT inhaler Inhale 2 puffs into the lungs every 6 (six) hours as needed for wheezing or shortness of breath. 1 Inhaler 0  . FLUoxetine (PROZAC) 20 MG capsule Take 1 capsule (20 mg total) by mouth daily. 90 capsule 0  . fluticasone (FLONASE) 50 MCG/ACT nasal spray Place 2 sprays into both nostrils daily.    Marland Kitchen. lisinopril (PRINIVIL,ZESTRIL) 20 MG tablet Take 1 tablet (20 mg total) by mouth daily. 90 tablet 0  . naproxen (NAPROSYN) 500 MG tablet Take 1 tablet (500 mg total) by mouth 2 (two) times daily with a meal. 60  tablet 0   No current facility-administered medications on file prior to visit.    Allergies  Allergen Reactions  . Tylenol [Acetaminophen]     GI: Denies bowel incontinence GU: Denies bladder incontinence. MSK: Pt reports right lower back pain.  Denies knee pain. Neuro: Denies numbness or tingling or saddle anesthesia.     Objective:   Physical Exam  BP (!) 158/98   Pulse 89   Temp 98.5 F (36.9 C) (Oral)   Wt (!) 327 lb (148.3 kg)   SpO2 97%   BMI 49.36 kg/m   General: Well-appearing, in no acute distress. Skin: 10 cm bruise along right flank, pt reports bumping into steel chair over the weekend.  Bruise nontender to palpation. Pulm: Clear to auscultation bilaterally. No wheezes, rales, or rhonchi. CV: Regular rate and rhythm.  No murmurs, rubs, or gallops. GI: Positive bowel sounds throughout.  Soft, tender to palpation RUQ under ribcage.  Negative Murphy's sign. MSK:  Lumbar spine nontender to palpation, right paraspinals tenderness to palpation.  Decreased hip flexion and adduction bilaterally.  Strength 5/5 at bilateral hips.  Full AROM lumbar spine, pain with flexion, lateral flexion, and rotation.  Negative SLR test bilaterally.   Neuro: Sensation to light touch intact throughout BLE.  Limping gait.      Assessment & Plan:   Right lower back pain  without sciatica:  Likely muscular eRx for Flexeril Rx for Vicoprofen q8hrs as needed for pain Stop Naproxen while taking Vicoprofen Back exercises provided  Call if symptoms worsen or do not resolve Aeric Burnham, NP

## 2015-09-12 ENCOUNTER — Telehealth: Payer: Self-pay | Admitting: Internal Medicine

## 2015-09-12 NOTE — Telephone Encounter (Signed)
Ok for work note? 

## 2015-09-12 NOTE — Telephone Encounter (Signed)
Pt called to let you know He is doing better but he did stay out of work today to make sure he get better and needs another work note for today.   Please advise when ready for pick up

## 2015-09-15 ENCOUNTER — Telehealth: Payer: Self-pay

## 2015-09-15 NOTE — Telephone Encounter (Signed)
Pt left v/m; pt last seen 09/09/15; when pt takes med does not have back pain but when effects of med are gone pt has back pain again;pt trying to work today and back pain is worse. Pt wants to know what to do next. Pt request cb. [facility] [place]. R Baity out of office.Please advise.

## 2015-09-15 NOTE — Telephone Encounter (Signed)
Reviewed visit with PCP. Patient recently provided with narcotics and Flexeril. Recommend he continue with Flexeril and Naproxen with food for at least another week. He may benefit from physical therapy if no significant improvement in 1 week.

## 2015-09-15 NOTE — Telephone Encounter (Signed)
Spoken and notified patient of Kate's comments. Patient verbalized understanding. 

## 2015-09-16 NOTE — Telephone Encounter (Signed)
Patient called to find out the status of his work note.  Please call patient at [phone].

## 2015-09-17 ENCOUNTER — Encounter: Payer: Self-pay | Admitting: Internal Medicine

## 2015-09-17 NOTE — Telephone Encounter (Signed)
Called pt, unable to lmovm

## 2015-09-17 NOTE — Telephone Encounter (Signed)
Letter placed in front office for pickup.

## 2015-09-17 NOTE — Telephone Encounter (Signed)
Pt called back. I informed him note was Ok'd by Rene Kocheregina.  Please put up front when ready. He will pick up later this afternoon, between 3-5pm  Thanks

## 2015-11-05 ENCOUNTER — Ambulatory Visit: Payer: BLUE CROSS/BLUE SHIELD | Admitting: Internal Medicine

## 2015-11-29 ENCOUNTER — Other Ambulatory Visit: Payer: Self-pay | Admitting: Internal Medicine

## 2015-12-01 NOTE — Telephone Encounter (Signed)
Received refill request electronically Last office visit 09/09/15 Medication is not on medication list

## 2016-01-02 ENCOUNTER — Other Ambulatory Visit: Payer: Self-pay | Admitting: Internal Medicine

## 2016-01-02 DIAGNOSIS — I10 Essential (primary) hypertension: Secondary | ICD-10-CM

## 2016-01-08 ENCOUNTER — Telehealth: Payer: Self-pay | Admitting: Internal Medicine

## 2016-01-08 NOTE — Telephone Encounter (Signed)
Pt called and said that he reinjured his muscle strain in his rib cage that was injured earlier this year. He is asking for a refill of the medication that was prescribed for him last time that helped him.  Can you please follow up with him.

## 2016-01-09 ENCOUNTER — Ambulatory Visit (INDEPENDENT_AMBULATORY_CARE_PROVIDER_SITE_OTHER): Payer: BLUE CROSS/BLUE SHIELD | Admitting: Internal Medicine

## 2016-01-09 ENCOUNTER — Encounter: Payer: Self-pay | Admitting: Internal Medicine

## 2016-01-09 VITALS — BP 140/90 | HR 74 | Temp 98.6°F | Wt 322.5 lb

## 2016-01-09 DIAGNOSIS — S29011A Strain of muscle and tendon of front wall of thorax, initial encounter: Secondary | ICD-10-CM

## 2016-01-09 MED ORDER — CYCLOBENZAPRINE HCL 10 MG PO TABS: 10.0000 mg | ORAL_TABLET | Freq: Three times a day (TID) | ORAL | 0 refills | Status: DC | PRN

## 2016-01-09 MED ORDER — HYDROCODONE-IBUPROFEN 5-200 MG PO TABS: 1.0000 | ORAL_TABLET | Freq: Three times a day (TID) | ORAL | 0 refills | Status: DC | PRN

## 2016-01-09 NOTE — Patient Instructions (Signed)
Chest Wall Pain °Introduction °Chest wall pain is pain in or around the bones and muscles of your chest. Sometimes, an injury causes this pain. Sometimes, the cause may not be known. This pain may take several weeks or longer to get better. °Follow these instructions at home: °Pay attention to any changes in your symptoms. Take these actions to help with your pain: °· Rest as told by your doctor. °· Avoid activities that cause pain. Try not to use your chest, belly (abdominal), or side muscles to lift heavy things. °· If directed, apply ice to the painful area: °¨ Put ice in a plastic bag. °¨ Place a towel between your skin and the bag. °¨ Leave the ice on for 20 minutes, 2-3 times per day. °· Take over-the-counter and prescription medicines only as told by your doctor. °· Do not use tobacco products, including cigarettes, chewing tobacco, and e-cigarettes. If you need help quitting, ask your doctor. °· Keep all follow-up visits as told by your doctor. This is important. °Contact a doctor if: °· You have a fever. °· Your chest pain gets worse. °· You have new symptoms. °Get help right away if: °· You feel sick to your stomach (nauseous) or you throw up (vomit). °· You feel sweaty or light-headed. °· You have a cough with phlegm (sputum) or you cough up blood. °· You are short of breath. °This information is not intended to replace advice given to you by your health care provider. Make sure you discuss any questions you have with your health care provider. °Document Released: 06/30/2007 Document Revised: 06/19/2015 Document Reviewed: 04/08/2014 °© 2017 Elsevier ° °

## 2016-01-09 NOTE — Progress Notes (Signed)
Subjective:    Patient ID: Anthony BarriosJeremy Peckham, male    DOB: 03/25/1980, 35 y.o.   MRN: [id number]  HPI  Pt presents to the clinic today with c/o chest wall pain. He reports this started 2 days ago at work, when he was tossing bags into his truck. He reports he heard a "pop" and felt immediate pain in the area, just under the left nipple. He describes the pain as sharp and stabbing. He reports the pain can radiate around to his upper back. The pain is worse with movement. He denies chest pain or shortness of breath. He has tried Aleve without any relief.  Review of Systems      Past Medical History:  Diagnosis Date  . Anxiety   . Depression   . Insomnia     Current Outpatient Prescriptions  Medication Sig Dispense Refill  . albuterol (PROVENTIL HFA;VENTOLIN HFA) 108 (90 Base) MCG/ACT inhaler Inhale 2 puffs into the lungs every 6 (six) hours as needed for wheezing or shortness of breath. 1 Inhaler 0  . amLODipine (NORVASC) 5 MG tablet TAKE 1 TABLET (5 MG TOTAL) BY MOUTH DAILY. 90 tablet 1  . cyclobenzaprine (FLEXERIL) 10 MG tablet Take 1 tablet (10 mg total) by mouth 3 (three) times daily as needed for muscle spasms. 30 tablet 0  . FLUoxetine (PROZAC) 20 MG capsule Take 1 capsule (20 mg total) by mouth daily. 90 capsule 0  . fluticasone (FLONASE) 50 MCG/ACT nasal spray Place 2 sprays into both nostrils daily.    Marland Kitchen. HYDROcodone-ibuprofen (VICOPROFEN) 7.5-200 MG tablet Take 1 tablet by mouth every 8 (eight) hours as needed for moderate pain. 30 tablet 0  . lisinopril (PRINIVIL,ZESTRIL) 20 MG tablet TAKE 1 TABLET (20 MG TOTAL) BY MOUTH DAILY. 90 tablet 0  . naproxen (NAPROSYN) 500 MG tablet Take 1 tablet (500 mg total) by mouth 2 (two) times daily with a meal. 60 tablet 0   No current facility-administered medications for this visit.     Allergies  Allergen Reactions  . Tylenol [Acetaminophen]     Family History  Problem Relation Age of Onset  . Mental illness Mother   . Hypertension  Father     Social History   Social History  . Marital status: Married    Spouse name: N/A  . Number of children: N/A  . Years of education: N/A   Occupational History  . Not on file.   Social History Main Topics  . Smoking status: Former Games developermoker  . Smokeless tobacco: Never Used  . Alcohol use No  . Drug use: No  . Sexual activity: Not on file   Other Topics Concern  . Not on file   Social History Narrative  . No narrative on file     Constitutional: Denies fever, malaise, fatigue, headache or abrupt weight changes.  Respiratory: Denies difficulty breathing, shortness of breath, cough or sputum production.   Cardiovascular: Denies chest pain, chest tightness, palpitations or swelling in the hands or feet.  Musculoskeletal: Pt reports chest wall pain. Denies decrease in range of motion, difficulty with gait, or joint pain and swelling.    No other specific complaints in a complete review of systems (except as listed in HPI above).  Objective:   Physical Exam   BP 140/90   Pulse 74   Temp 98.6 F (37 C) (Oral)   Wt (!) 322 lb 8 oz (146.3 kg)   SpO2 97%   BMI 48.68 kg/m  Wt Readings from Last  3 Encounters:  01/09/16 (!) 322 lb 8 oz (146.3 kg)  09/09/15 (!) 327 lb (148.3 kg)  09/04/15 (!) 333 lb (151 kg)    General: Appears his stated age, obese in NAD. Musculoskeletal: Chest wall tender with palpation just under the left nipple, extending to the mid axillary area.   BMET    Component Value Date/Time   NA 139 09/26/2014 1528   NA 142 01/14/2013 1416   K 4.0 09/26/2014 1528   K 4.1 01/14/2013 1416   CL 107 09/26/2014 1528   CL 109 (H) 01/14/2013 1416   CO2 25 09/26/2014 1528   CO2 27 01/14/2013 1416   GLUCOSE 80 09/26/2014 1528   GLUCOSE 113 (H) 01/14/2013 1416   BUN 11 09/26/2014 1528   BUN 11 01/14/2013 1416   CREATININE 0.71 09/26/2014 1528   CREATININE 0.76 03/09/2013 1601   CALCIUM 9.2 09/26/2014 1528   CALCIUM 9.3 01/14/2013 1416   GFRNONAA  >60 01/14/2013 1416   GFRAA >60 01/14/2013 1416    Lipid Panel     Component Value Date/Time   CHOL 136 09/26/2014 1528   TRIG 91.0 09/26/2014 1528   HDL 32.00 (L) 09/26/2014 1528   CHOLHDL 4 09/26/2014 1528   VLDL 18.2 09/26/2014 1528   LDLCALC 86 09/26/2014 1528    CBC    Component Value Date/Time   WBC 9.3 09/26/2014 1528   RBC 5.14 09/26/2014 1528   HGB 14.4 09/26/2014 1528   HGB 15.5 01/14/2013 1416   HCT 42.9 09/26/2014 1528   HCT 48.2 01/14/2013 1416   PLT 236.0 09/26/2014 1528   PLT 205 01/14/2013 1416   MCV 83.5 09/26/2014 1528   MCV 85 01/14/2013 1416   MCH 29.7 01/11/2014 1540   MCHC 33.5 09/26/2014 1528   RDW 14.2 09/26/2014 1528   RDW 14.0 01/14/2013 1416   LYMPHSABS 2.6 01/11/2014 1540   MONOABS 0.9 01/11/2014 1540   EOSABS 0.2 01/11/2014 1540   BASOSABS 0.1 01/11/2014 1540    Hgb A1C Lab Results  Component Value Date   HGBA1C 5.9 09/26/2014         Assessment & Plan:   Muscle strain of chest wall:  Heat may be helpful RX for Vicoprofen TID as needed eRx for Flexeril TID as needed Stretching exercises given Work note provided  RTC as needed or if symptoms persist or worsen Nicki ReaperBAITY, Kasiah Manka, NP

## 2016-01-09 NOTE — Telephone Encounter (Signed)
Mel-  Can you call him and see if it is Naproxen or Flexril

## 2016-01-09 NOTE — Telephone Encounter (Signed)
Pt has appt today 4:15

## 2016-01-14 ENCOUNTER — Other Ambulatory Visit: Payer: Self-pay | Admitting: Internal Medicine

## 2016-01-14 ENCOUNTER — Encounter: Payer: Self-pay | Admitting: Internal Medicine

## 2016-01-14 MED ORDER — HYDROCODONE-ACETAMINOPHEN 5-325 MG PO TABS: 1.0000 | ORAL_TABLET | Freq: Four times a day (QID) | ORAL | 0 refills | Status: DC | PRN

## 2016-02-19 ENCOUNTER — Encounter: Payer: Self-pay | Admitting: Internal Medicine

## 2016-02-19 ENCOUNTER — Ambulatory Visit (INDEPENDENT_AMBULATORY_CARE_PROVIDER_SITE_OTHER): Payer: BLUE CROSS/BLUE SHIELD | Admitting: Internal Medicine

## 2016-02-19 DIAGNOSIS — I10 Essential (primary) hypertension: Secondary | ICD-10-CM

## 2016-02-19 DIAGNOSIS — F329 Major depressive disorder, single episode, unspecified: Secondary | ICD-10-CM | POA: Diagnosis not present

## 2016-02-19 MED ORDER — AMLODIPINE BESYLATE 10 MG PO TABS: 10.0000 mg | ORAL_TABLET | Freq: Every day | ORAL | 2 refills | Status: DC

## 2016-02-19 MED ORDER — BUPROPION HCL ER (XL) 150 MG PO TB24: 150.0000 mg | ORAL_TABLET | Freq: Every day | ORAL | 2 refills | Status: DC

## 2016-02-19 NOTE — Assessment & Plan Note (Signed)
Elevated Encouraged him to continue Lisinopril Advised him to start taking Norvasc, eRx sent to pharmacy Discussed the importance of weight loss

## 2016-02-19 NOTE — Patient Instructions (Signed)

## 2016-02-19 NOTE — Progress Notes (Signed)
Subjective:    Patient ID: Anthony Myers, male    DOB: 01-13-1981, 36 y.o.   MRN: [id number]  HPI  Pt presents to the clinic today with c/o nasal congestion. This started this morning. He is not blowing anything out of his nose. He denies ear pain, sore throat or cough. He has had chills but no body aches or fever. He reports his symptoms have improved throughout the day without any medication.He has no history of allergies or breathing problems. He has had sick contacts. He did not get his flu shot this year.  He also wants to discuss depression. He and his wife got separated 08/2015. She got a restraining order on him and took the kids to her mother's house. He is only allowed to see his children every other weekend. He reports he is in that big house alone, all the time now. It is really wearing him down. He doesn't want to get up in the morning and go to work. He has no appetite. He is having trouble falling asleep. He is currently taking Prozac but reports it is not quite doing the job for him. He denies SI/HI.  Of note, his BP today is 138/90. He reports he is only taking the Lisinopril, not the Amlodipine. He denies headaches, dizziness, chest pain or shortness of breath.  Review of Systems      Past Medical History:  Diagnosis Date  . Anxiety   . Depression   . Insomnia     Current Outpatient Prescriptions  Medication Sig Dispense Refill  . albuterol (PROVENTIL HFA;VENTOLIN HFA) 108 (90 Base) MCG/ACT inhaler Inhale 2 puffs into the lungs every 6 (six) hours as needed for wheezing or shortness of breath. 1 Inhaler 0  . amLODipine (NORVASC) 5 MG tablet TAKE 1 TABLET (5 MG TOTAL) BY MOUTH DAILY. 90 tablet 1  . cyclobenzaprine (FLEXERIL) 10 MG tablet Take 1 tablet (10 mg total) by mouth 3 (three) times daily as needed for muscle spasms. 30 tablet 0  . FLUoxetine (PROZAC) 20 MG capsule Take 1 capsule (20 mg total) by mouth daily. 90 capsule 0  . fluticasone (FLONASE) 50 MCG/ACT nasal  spray Place 2 sprays into both nostrils daily.    Marland Kitchen HYDROcodone-acetaminophen (NORCO/VICODIN) 5-325 MG tablet Take 1 tablet by mouth every 6 (six) hours as needed for moderate pain. 30 tablet 0  . hydrocodone-ibuprofen (VICOPROFEN) 5-200 MG tablet Take 1 tablet by mouth every 8 (eight) hours as needed for pain. 30 tablet 0  . lisinopril (PRINIVIL,ZESTRIL) 20 MG tablet TAKE 1 TABLET (20 MG TOTAL) BY MOUTH DAILY. 90 tablet 0  . naproxen (NAPROSYN) 500 MG tablet Take 1 tablet (500 mg total) by mouth 2 (two) times daily with a meal. 60 tablet 0   No current facility-administered medications for this visit.     Allergies  Allergen Reactions  . Tylenol [Acetaminophen]     Family History  Problem Relation Age of Onset  . Mental illness Mother   . Hypertension Father     Social History   Social History  . Marital status: Married    Spouse name: N/A  . Number of children: N/A  . Years of education: N/A   Occupational History  . Not on file.   Social History Main Topics  . Smoking status: Former Games developer  . Smokeless tobacco: Never Used  . Alcohol use No  . Drug use: No  . Sexual activity: Not on file   Other Topics Concern  .  Not on file   Social History Narrative  . No narrative on file     Constitutional: Denies fever, malaise, fatigue, headache or abrupt weight changes.  HEENT: Pt reports nasal congestion. Denies eye pain, eye redness, ear pain, ringing in the ears, wax buildup, runny nose, bloody nose, or sore throat. Respiratory: Denies difficulty breathing, shortness of breath, cough or sputum production.   Cardiovascular: Denies chest pain, chest tightness, palpitations or swelling in the hands or feet.  Gastrointestinal: Pt reports poor appetite. Denies abdominal pain, bloating, constipation, diarrhea or blood in the stool.  Psych: Pt reports anxiety and depression. Denies SI/HI.  No other specific complaints in a complete review of systems (except as listed in HPI  above).  Objective:   Physical Exam   BP 138/90   Pulse 80   Temp 98 F (36.7 C) (Oral)   Wt (!) 315 lb 12 oz (143.2 kg)   SpO2 97%   BMI 47.66 kg/m  Wt Readings from Last 3 Encounters:  02/19/16 (!) 315 lb 12 oz (143.2 kg)  01/09/16 (!) 322 lb 8 oz (146.3 kg)  09/09/15 (!) 327 lb (148.3 kg)    General: Appears his stated age, obese in NAD. Cardiovascular: Normal rate and rhythm.  Pulmonary/Chest: Normal effort and positive vesicular breath sounds. No respiratory distress. No wheezes, rales or ronchi noted.  Neurological: Alert and oriented.  Psychiatric: He is tearful at times, but maintains eye contact. Judgement is normal.  BMET    Component Value Date/Time   NA 139 09/26/2014 1528   NA 142 01/14/2013 1416   K 4.0 09/26/2014 1528   K 4.1 01/14/2013 1416   CL 107 09/26/2014 1528   CL 109 (H) 01/14/2013 1416   CO2 25 09/26/2014 1528   CO2 27 01/14/2013 1416   GLUCOSE 80 09/26/2014 1528   GLUCOSE 113 (H) 01/14/2013 1416   BUN 11 09/26/2014 1528   BUN 11 01/14/2013 1416   CREATININE 0.71 09/26/2014 1528   CREATININE 0.76 03/09/2013 1601   CALCIUM 9.2 09/26/2014 1528   CALCIUM 9.3 01/14/2013 1416   GFRNONAA >60 01/14/2013 1416   GFRAA >60 01/14/2013 1416    Lipid Panel     Component Value Date/Time   CHOL 136 09/26/2014 1528   TRIG 91.0 09/26/2014 1528   HDL 32.00 (L) 09/26/2014 1528   CHOLHDL 4 09/26/2014 1528   VLDL 18.2 09/26/2014 1528   LDLCALC 86 09/26/2014 1528    CBC    Component Value Date/Time   WBC 9.3 09/26/2014 1528   RBC 5.14 09/26/2014 1528   HGB 14.4 09/26/2014 1528   HGB 15.5 01/14/2013 1416   HCT 42.9 09/26/2014 1528   HCT 48.2 01/14/2013 1416   PLT 236.0 09/26/2014 1528   PLT 205 01/14/2013 1416   MCV 83.5 09/26/2014 1528   MCV 85 01/14/2013 1416   MCH 29.7 01/11/2014 1540   MCHC 33.5 09/26/2014 1528   RDW 14.2 09/26/2014 1528   RDW 14.0 01/14/2013 1416   LYMPHSABS 2.6 01/11/2014 1540   MONOABS 0.9 01/11/2014 1540   EOSABS  0.2 01/11/2014 1540   BASOSABS 0.1 01/11/2014 1540    Hgb A1C Lab Results  Component Value Date   HGBA1C 5.9 09/26/2014           Assessment & Plan:   Nasal congestion:  Improved Use Flonase OTC  BAITY, REGINA, NP

## 2016-02-19 NOTE — Assessment & Plan Note (Signed)
Deteriorated Support offered today We discussed increasing the Prozac, but he would rather add an adjunct at this point eRx for Wellbutrin XL 150 mg daily He is not interested in referral for counseling at this time  RTC in 4 weeks to follow up depression

## 2016-03-18 ENCOUNTER — Other Ambulatory Visit: Payer: Self-pay

## 2016-03-18 MED ORDER — BUPROPION HCL ER (XL) 150 MG PO TB24: 150.0000 mg | ORAL_TABLET | Freq: Every day | ORAL | 0 refills | Status: DC

## 2016-03-18 MED ORDER — AMLODIPINE BESYLATE 10 MG PO TABS: 10.0000 mg | ORAL_TABLET | Freq: Every day | ORAL | 0 refills | Status: DC

## 2016-03-18 NOTE — Addendum Note (Signed)
Addended by: Roena MaladyEVONTENNO, Cola Gane Y on: 03/18/2016 05:02 PM   Modules accepted: Orders

## 2016-04-05 ENCOUNTER — Other Ambulatory Visit: Payer: Self-pay | Admitting: Internal Medicine

## 2016-04-05 DIAGNOSIS — I10 Essential (primary) hypertension: Secondary | ICD-10-CM

## 2016-05-11 ENCOUNTER — Ambulatory Visit (INDEPENDENT_AMBULATORY_CARE_PROVIDER_SITE_OTHER): Payer: BLUE CROSS/BLUE SHIELD | Admitting: Internal Medicine

## 2016-05-11 ENCOUNTER — Encounter: Payer: Self-pay | Admitting: Internal Medicine

## 2016-05-11 VITALS — BP 138/92 | HR 91 | Temp 98.1°F | Wt 309.0 lb

## 2016-05-11 DIAGNOSIS — H578 Other specified disorders of eye and adnexa: Secondary | ICD-10-CM | POA: Diagnosis not present

## 2016-05-11 DIAGNOSIS — I10 Essential (primary) hypertension: Secondary | ICD-10-CM

## 2016-05-11 DIAGNOSIS — F419 Anxiety disorder, unspecified: Secondary | ICD-10-CM | POA: Diagnosis not present

## 2016-05-11 DIAGNOSIS — H5789 Other specified disorders of eye and adnexa: Secondary | ICD-10-CM

## 2016-05-11 DIAGNOSIS — F329 Major depressive disorder, single episode, unspecified: Secondary | ICD-10-CM | POA: Diagnosis not present

## 2016-05-11 MED ORDER — BUPROPION HCL ER (XL) 150 MG PO TB24: 150.0000 mg | ORAL_TABLET | Freq: Every day | ORAL | 1 refills | Status: DC

## 2016-05-11 MED ORDER — FLUOXETINE HCL 20 MG PO CAPS: 20.0000 mg | ORAL_CAPSULE | Freq: Every day | ORAL | 1 refills | Status: DC

## 2016-05-11 NOTE — Assessment & Plan Note (Signed)
Situation but persistent He feels like he doesn't have a good support system but he declines referral for therapy at this time Continue Prozac and Wellbutrin- medication refilled today Will monitor

## 2016-05-11 NOTE — Patient Instructions (Signed)
Hypertension °Hypertension is another name for high blood pressure. High blood pressure forces your heart to work harder to pump blood. This can cause problems over time. °There are two numbers in a blood pressure reading. There is a top number (systolic) over a bottom number (diastolic). It is best to have a blood pressure below 120/80. Healthy choices can help lower your blood pressure. You may need medicine to help lower your blood pressure if: °· Your blood pressure cannot be lowered with healthy choices. °· Your blood pressure is higher than 130/80. °Follow these instructions at home: °Eating and drinking  °· If directed, follow the DASH eating plan. This diet includes: °¨ Filling half of your plate at each meal with fruits and vegetables. °¨ Filling one quarter of your plate at each meal with whole grains. Whole grains include whole wheat pasta, brown rice, and whole grain bread. °¨ Eating or drinking low-fat dairy products, such as skim milk or low-fat yogurt. °¨ Filling one quarter of your plate at each meal with low-fat (lean) proteins. Low-fat proteins include fish, skinless chicken, eggs, beans, and tofu. °¨ Avoiding fatty meat, cured and processed meat, or chicken with skin. °¨ Avoiding premade or processed food. °· Eat less than 1,500 mg of salt (sodium) a day. °· Limit alcohol use to no more than 1 drink a day for nonpregnant women and 2 drinks a day for men. One drink equals 12 oz of beer, 5 oz of wine, or 1½ oz of hard liquor. °Lifestyle  °· Work with your doctor to stay at a healthy weight or to lose weight. Ask your doctor what the best weight is for you. °· Get at least 30 minutes of exercise that causes your heart to beat faster (aerobic exercise) most days of the week. This may include walking, swimming, or biking. °· Get at least 30 minutes of exercise that strengthens your muscles (resistance exercise) at least 3 days a week. This may include lifting weights or pilates. °· Do not use any  products that contain nicotine or tobacco. This includes cigarettes and e-cigarettes. If you need help quitting, ask your doctor. °· Check your blood pressure at home as told by your doctor. °· Keep all follow-up visits as told by your doctor. This is important. °Medicines  °· Take over-the-counter and prescription medicines only as told by your doctor. Follow directions carefully. °· Do not skip doses of blood pressure medicine. The medicine does not work as well if you skip doses. Skipping doses also puts you at risk for problems. °· Ask your doctor about side effects or reactions to medicines that you should watch for. °Contact a doctor if: °· You think you are having a reaction to the medicine you are taking. °· You have headaches that keep coming back (recurring). °· You feel dizzy. °· You have swelling in your ankles. °· You have trouble with your vision. °Get help right away if: °· You get a very bad headache. °· You start to feel confused. °· You feel weak or numb. °· You feel faint. °· You get very bad pain in your: °¨ Chest. °¨ Belly (abdomen). °· You throw up (vomit) more than once. °· You have trouble breathing. °Summary °· Hypertension is another name for high blood pressure. °· Making healthy choices can help lower blood pressure. If your blood pressure cannot be controlled with healthy choices, you may need to take medicine. °This information is not intended to replace advice given to you by your   health care provider. Make sure you discuss any questions you have with your health care provider. °Document Released: 06/30/2007 Document Revised: 12/10/2015 Document Reviewed: 12/10/2015 °Elsevier Interactive Patient Education © 2017 [employer] ° °

## 2016-05-11 NOTE — Progress Notes (Signed)
Subjective:    Patient ID: Anthony Myers, male    DOB: 04-Nov-1980, 36 y.o.   MRN: [id number]  HPI  Pt presents to the clinic today with c/o swelling of his left eye. He reports his eye was bothering him all day Friday. He woke up on Sunday, with swelling and what appears to be bruising under his left eye. He denies any trauma to the area. He denies blurred vision. He has not taken anything  OTC for this.  He is also requesting a refill of his Wellbutrin and Prozac. He reports his anxiety and depression are slightly worse. He is going through a separation. His lawnmower guy quit mowing his grass. He has not had a chance to fill out his taxes, which is adding stress to his situation. He also reports his boss at work is "out to get him". He has been having trouble coping. He is taking the Wellbutrin but has been out of his Prozac for the last week. He would like a refill of his medications today.  Of note, his BP today is 138/92. He is taking Lisinopril and Amlodipine as prescribed. He is under a lot of stress right now and knows that is what is raising his blood pressure up.  Review of Systems      Past Medical History:  Diagnosis Date  . Anxiety   . Depression   . Insomnia     Current Outpatient Prescriptions  Medication Sig Dispense Refill  . amLODipine (NORVASC) 10 MG tablet Take 1 tablet (10 mg total) by mouth daily. 90 tablet 0  . buPROPion (WELLBUTRIN XL) 150 MG 24 hr tablet Take 1 tablet (150 mg total) by mouth daily. 90 tablet 0  . FLUoxetine (PROZAC) 20 MG capsule Take 1 capsule (20 mg total) by mouth daily. 90 capsule 0  . lisinopril (PRINIVIL,ZESTRIL) 20 MG tablet TAKE 1 TABLET (20 MG TOTAL) BY MOUTH DAILY. 90 tablet 0   No current facility-administered medications for this visit.     Allergies  Allergen Reactions  . Tylenol [Acetaminophen]     Family History  Problem Relation Age of Onset  . Mental illness Mother   . Hypertension Father     Social History    Social History  . Marital status: Married    Spouse name: N/A  . Number of children: N/A  . Years of education: N/A   Occupational History  . Not on file.   Social History Main Topics  . Smoking status: Former Games developer  . Smokeless tobacco: Never Used  . Alcohol use No  . Drug use: No  . Sexual activity: Not on file   Other Topics Concern  . Not on file   Social History Narrative  . No narrative on file     Constitutional: Denies fever, malaise, fatigue, headache or abrupt weight changes.  Respiratory: Denies difficulty breathing, shortness of breath, cough or sputum production.   Cardiovascular: Denies chest pain, chest tightness, palpitations or swelling in the hands or feet.  Skin: Pt reports swelling and bruising under left eye. Denies  rashes, lesions or ulcercations.  Neurological: Denies dizziness, difficulty with memory, difficulty with speech or problems with balance and coordination.  Psych: Pt reports anxiety and depression. Denies SI/HI.  No other specific complaints in a complete review of systems (except as listed in HPI above).  Objective:   Physical Exam  BP (!) 138/92   Pulse 91   Temp 98.1 F (36.7 C) (Oral)   Wt Marland Kitchen)  309 lb (140.2 kg)   SpO2 98%   BMI 46.64 kg/m  Wt Readings from Last 3 Encounters:  05/11/16 (!) 309 lb (140.2 kg)  02/19/16 (!) 315 lb 12 oz (143.2 kg)  01/09/16 (!) 322 lb 8 oz (146.3 kg)    General: Appears his stated age, obese in NAD. Skin: Warm, dry and intact. Mild swelling noted underneath left eye. Ecchymosis noted. Cardiovascular: Normal rate and rhythm. S1,S2 noted.  No murmur, rubs or gallops noted.  Pulmonary/Chest: Normal effort and positive vesicular breath sounds. No respiratory distress. No wheezes, rales or ronchi noted.  Neurological: Alert and oriented. Psychiatric: He is slightly tearful today. Behavior is normal. Judgment and thought content normal.    BMET    Component Value Date/Time   NA 139  09/26/2014 1528   NA 142 01/14/2013 1416   K 4.0 09/26/2014 1528   K 4.1 01/14/2013 1416   CL 107 09/26/2014 1528   CL 109 (H) 01/14/2013 1416   CO2 25 09/26/2014 1528   CO2 27 01/14/2013 1416   GLUCOSE 80 09/26/2014 1528   GLUCOSE 113 (H) 01/14/2013 1416   BUN 11 09/26/2014 1528   BUN 11 01/14/2013 1416   CREATININE 0.71 09/26/2014 1528   CREATININE 0.76 03/09/2013 1601   CALCIUM 9.2 09/26/2014 1528   CALCIUM 9.3 01/14/2013 1416   GFRNONAA >60 01/14/2013 1416   GFRAA >60 01/14/2013 1416    Lipid Panel     Component Value Date/Time   CHOL 136 09/26/2014 1528   TRIG 91.0 09/26/2014 1528   HDL 32.00 (L) 09/26/2014 1528   CHOLHDL 4 09/26/2014 1528   VLDL 18.2 09/26/2014 1528   LDLCALC 86 09/26/2014 1528    CBC    Component Value Date/Time   WBC 9.3 09/26/2014 1528   RBC 5.14 09/26/2014 1528   HGB 14.4 09/26/2014 1528   HGB 15.5 01/14/2013 1416   HCT 42.9 09/26/2014 1528   HCT 48.2 01/14/2013 1416   PLT 236.0 09/26/2014 1528   PLT 205 01/14/2013 1416   MCV 83.5 09/26/2014 1528   MCV 85 01/14/2013 1416   MCH 29.7 01/11/2014 1540   MCHC 33.5 09/26/2014 1528   RDW 14.2 09/26/2014 1528   RDW 14.0 01/14/2013 1416   LYMPHSABS 2.6 01/11/2014 1540   MONOABS 0.9 01/11/2014 1540   EOSABS 0.2 01/11/2014 1540   BASOSABS 0.1 01/11/2014 1540    Hgb A1C Lab Results  Component Value Date   HGBA1C 5.9 09/26/2014            Assessment & Plan:   Periorbital Swelling of Left Eye:  Encouraged him to try cool compresses 80 mg Depo IM today  RTC as needed or if symptoms persist or worsen BAITY, REGINA, NP

## 2016-05-11 NOTE — Assessment & Plan Note (Signed)
Elevated today He thinks this is stress related and does not want to adjust his BP medications at this time Continue Amlodipine and Lisinopril for now Encouraged ongoing weight loss

## 2016-05-17 ENCOUNTER — Telehealth: Payer: Self-pay | Admitting: Internal Medicine

## 2016-05-17 NOTE — Telephone Encounter (Signed)
Done, given back to Robin 

## 2016-05-17 NOTE — Telephone Encounter (Signed)
[facility] paperwork faxed In [facility] IN BOX For review and signature

## 2016-05-19 MED ORDER — METHYLPREDNISOLONE ACETATE 80 MG/ML IJ SUSP
80.0000 mg | Freq: Once | INTRAMUSCULAR | Status: AC
  Administered 2016-05-11: 80 mg via INTRAMUSCULAR

## 2016-05-19 NOTE — Addendum Note (Signed)
Addended by: Roena Malady on: 05/19/2016 11:45 AM   Modules accepted: Orders

## 2016-05-20 NOTE — Telephone Encounter (Signed)
Pt aware.

## 2016-05-20 NOTE — Telephone Encounter (Signed)
Disregard message 05/20/16 @ 7:14  Paperwork faxed 05/20/16 Copy for file Copy for scan Copy for pt

## 2016-05-20 NOTE — Telephone Encounter (Signed)
Paperwork faxed 4/17 Copy for file Copy for scan Copy for pt

## 2016-06-28 ENCOUNTER — Other Ambulatory Visit: Payer: Self-pay | Admitting: Internal Medicine

## 2016-06-28 MED ORDER — BUPROPION HCL ER (XL) 150 MG PO TB24: 150.0000 mg | ORAL_TABLET | Freq: Every day | ORAL | 1 refills | Status: DC

## 2016-07-07 ENCOUNTER — Other Ambulatory Visit: Payer: Self-pay | Admitting: Internal Medicine

## 2016-07-07 DIAGNOSIS — I10 Essential (primary) hypertension: Secondary | ICD-10-CM

## 2016-08-09 ENCOUNTER — Encounter: Payer: BLUE CROSS/BLUE SHIELD | Admitting: Internal Medicine

## 2016-08-16 ENCOUNTER — Ambulatory Visit (INDEPENDENT_AMBULATORY_CARE_PROVIDER_SITE_OTHER): Payer: BLUE CROSS/BLUE SHIELD | Admitting: Internal Medicine

## 2016-08-16 ENCOUNTER — Encounter: Payer: Self-pay | Admitting: Internal Medicine

## 2016-08-16 VITALS — BP 132/84 | HR 87 | Temp 98.3°F | Ht 68.0 in | Wt 305.0 lb

## 2016-08-16 DIAGNOSIS — Z0001 Encounter for general adult medical examination with abnormal findings: Secondary | ICD-10-CM | POA: Diagnosis not present

## 2016-08-16 DIAGNOSIS — H65111 Acute and subacute allergic otitis media (mucoid) (sanguinous) (serous), right ear: Secondary | ICD-10-CM | POA: Diagnosis not present

## 2016-08-16 DIAGNOSIS — R7989 Other specified abnormal findings of blood chemistry: Secondary | ICD-10-CM

## 2016-08-16 MED ORDER — AMOXICILLIN 875 MG PO TABS: 875.0000 mg | ORAL_TABLET | Freq: Two times a day (BID) | ORAL | 0 refills | Status: DC

## 2016-08-16 NOTE — Progress Notes (Signed)
Subjective:    Patient ID: Anthony Myers, male    DOB: Jan 25, 1981, 36 y.o.   MRN: [id number]  HPI  Pt presents to the clinic today for his annual exam.  Flu: 02/2015 Tetanus: 09/2014 Dentist: biannually  Diet: He does eat lean meat. He does eat fruits and veggies daily. He tries to avoid fried food. He drinks mostly water. Exercise: None  Review of Systems      Past Medical History:  Diagnosis Date  . Anxiety   . Depression   . Insomnia     Current Outpatient Prescriptions  Medication Sig Dispense Refill  . amLODipine (NORVASC) 10 MG tablet TAKE 1 TABLET (10 MG TOTAL) BY MOUTH DAILY. 90 tablet 0  . buPROPion (WELLBUTRIN XL) 150 MG 24 hr tablet Take 1 tablet (150 mg total) by mouth daily. 90 tablet 1  . FLUoxetine (PROZAC) 20 MG capsule Take 1 capsule (20 mg total) by mouth daily. 90 capsule 1  . lisinopril (PRINIVIL,ZESTRIL) 20 MG tablet TAKE 1 TABLET (20 MG TOTAL) BY MOUTH DAILY. 90 tablet 0   No current facility-administered medications for this visit.     Allergies  Allergen Reactions  . Tylenol [Acetaminophen]     Family History  Problem Relation Age of Onset  . Mental illness Mother   . Hypertension Father     Social History   Social History  . Marital status: Married    Spouse name: N/A  . Number of children: N/A  . Years of education: N/A   Occupational History  . Not on file.   Social History Main Topics  . Smoking status: Former Games developer  . Smokeless tobacco: Never Used  . Alcohol use No  . Drug use: No  . Sexual activity: Not on file   Other Topics Concern  . Not on file   Social History Narrative  . No narrative on file     Constitutional: Denies fever, malaise, fatigue, headache or abrupt weight changes.  HEENT: Denies eye pain, eye redness, ear pain, ringing in the ears, wax buildup, runny nose, nasal congestion, bloody nose, or sore throat. Respiratory: Denies difficulty breathing, shortness of breath, cough or sputum production.     Cardiovascular: Denies chest pain, chest tightness, palpitations or swelling in the hands or feet.  Gastrointestinal: Pt reports intermittent reflux. Denies abdominal pain, bloating, constipation, diarrhea or blood in the stool.  GU: Denies urgency, frequency, pain with urination, burning sensation, blood in urine, odor or discharge. Musculoskeletal: Denies decrease in range of motion, difficulty with gait, muscle pain or joint pain and swelling.  Skin: Denies redness, rashes, lesions or ulcercations.  Neurological: Denies dizziness, difficulty with memory, difficulty with speech or problems with balance and coordination.  Psych: Pt has history of anxiety and depression. Denies SI/HI.  No other specific complaints in a complete review of systems (except as listed in HPI above).  Objective:   Physical Exam   BP 132/84   Pulse 87   Temp 98.3 F (36.8 C) (Oral)   Ht 5\' 8"  (1.727 m)   Wt (!) 305 lb (138.3 kg)   SpO2 98%   BMI 46.38 kg/m  Wt Readings from Last 3 Encounters:  08/16/16 (!) 305 lb (138.3 kg)  05/11/16 (!) 309 lb (140.2 kg)  02/19/16 (!) 315 lb 12 oz (143.2 kg)    General: Appears his stated age, obese in NAD. Skin: Warm, dry and intact.  HEENT: Head: normal shape and size; Eyes: sclera white, no icterus, conjunctiva pink,  PERRLA and EOMs intact; Left Ear: Tm's gray and intact, normal light reflex; Right Ear: TM red and inflamed;  Throat/Mouth: Teeth present, mucosa pink and moist, no exudate, lesions or ulcerations noted.  Neck:  Neck supple, trachea midline. No masses, lumps or thyromegaly present.  Cardiovascular: Normal rate and rhythm. S1,S2 noted.  No murmur, rubs or gallops noted. No JVD or BLE edema. Pulmonary/Chest: Normal effort and positive vesicular breath sounds. No respiratory distress. No wheezes, rales or ronchi noted.  Abdomen: Soft and nontender. Normal bowel sounds. No distention or masses noted. Liver, spleen and kidneys non palpable. Musculoskeletal:  Strength 5/5 BUE/BLE. No difficulty with gait.  Neurological: Alert and oriented. Cranial nerves II-XII grossly intact. Coordination normal.  Psychiatric: Mood and affect normal. Behavior is normal. Judgment and thought content normal.    BMET    Component Value Date/Time   NA 139 09/26/2014 1528   NA 142 01/14/2013 1416   K 4.0 09/26/2014 1528   K 4.1 01/14/2013 1416   CL 107 09/26/2014 1528   CL 109 (H) 01/14/2013 1416   CO2 25 09/26/2014 1528   CO2 27 01/14/2013 1416   GLUCOSE 80 09/26/2014 1528   GLUCOSE 113 (H) 01/14/2013 1416   BUN 11 09/26/2014 1528   BUN 11 01/14/2013 1416   CREATININE 0.71 09/26/2014 1528   CREATININE 0.76 03/09/2013 1601   CALCIUM 9.2 09/26/2014 1528   CALCIUM 9.3 01/14/2013 1416   GFRNONAA >60 01/14/2013 1416   GFRAA >60 01/14/2013 1416    Lipid Panel     Component Value Date/Time   CHOL 136 09/26/2014 1528   TRIG 91.0 09/26/2014 1528   HDL 32.00 (L) 09/26/2014 1528   CHOLHDL 4 09/26/2014 1528   VLDL 18.2 09/26/2014 1528   LDLCALC 86 09/26/2014 1528    CBC    Component Value Date/Time   WBC 9.3 09/26/2014 1528   RBC 5.14 09/26/2014 1528   HGB 14.4 09/26/2014 1528   HGB 15.5 01/14/2013 1416   HCT 42.9 09/26/2014 1528   HCT 48.2 01/14/2013 1416   PLT 236.0 09/26/2014 1528   PLT 205 01/14/2013 1416   MCV 83.5 09/26/2014 1528   MCV 85 01/14/2013 1416   MCH 29.7 01/11/2014 1540   MCHC 33.5 09/26/2014 1528   RDW 14.2 09/26/2014 1528   RDW 14.0 01/14/2013 1416   LYMPHSABS 2.6 01/11/2014 1540   MONOABS 0.9 01/11/2014 1540   EOSABS 0.2 01/11/2014 1540   BASOSABS 0.1 01/11/2014 1540    Hgb A1C Lab Results  Component Value Date   HGBA1C 5.9 09/26/2014           Assessment & Plan:   Preventative Health Maintenance:  Encouraged him to get a flu shot UTD Tetanus UTD Encouraged him to consume a balanced diet and exercise regimen Advised him to see a dentist annually Will check CBC, CMET, Lipid and A1C today  Right Otitis  Media:  eRx for Amoxil BID x 10 days Start Flonase OTC  RTC in 1 year, sooner if needed Nicki ReaperBAITY, Akosua Constantine, NP

## 2016-08-16 NOTE — Patient Instructions (Signed)

## 2016-08-17 LAB — CBC
HCT: 44.1 % (ref 39.0–52.0)
Hemoglobin: 15 g/dL (ref 13.0–17.0)
MCHC: 33.9 g/dL (ref 30.0–36.0)
MCV: 96 fl (ref 78.0–100.0)
PLATELETS: 205 10*3/uL (ref 150.0–400.0)
RBC: 4.59 Mil/uL (ref 4.22–5.81)
RDW: 13.7 % (ref 11.5–15.5)
WBC: 8.9 10*3/uL (ref 4.0–10.5)

## 2016-08-17 LAB — COMPREHENSIVE METABOLIC PANEL
ALK PHOS: 85 U/L (ref 39–117)
ALT: 69 U/L — ABNORMAL HIGH (ref 0–53)
AST: 50 U/L — AB (ref 0–37)
Albumin: 4.2 g/dL (ref 3.5–5.2)
BILIRUBIN TOTAL: 0.4 mg/dL (ref 0.2–1.2)
BUN: 13 mg/dL (ref 6–23)
CALCIUM: 9.4 mg/dL (ref 8.4–10.5)
CHLORIDE: 101 meq/L (ref 96–112)
CO2: 26 mEq/L (ref 19–32)
CREATININE: 0.85 mg/dL (ref 0.40–1.50)
GFR: 108.37 mL/min (ref >60.00)
Glucose, Bld: 102 mg/dL — ABNORMAL HIGH (ref 70–99)
Potassium: 3.9 mEq/L (ref 3.5–5.1)
Sodium: 136 mEq/L (ref 135–145)
TOTAL PROTEIN: 6.8 g/dL (ref 6.0–8.3)

## 2016-08-17 LAB — LIPID PANEL
CHOLESTEROL: 164 mg/dL (ref 0–200)
HDL: 40.3 mg/dL (ref >39.00)
Total CHOL/HDL Ratio: 4

## 2016-08-17 LAB — HEMOGLOBIN A1C: HEMOGLOBIN A1C: 6.1 % (ref 4.6–6.5)

## 2016-08-17 LAB — LDL CHOLESTEROL, DIRECT: LDL DIRECT: 81 mg/dL

## 2016-08-18 ENCOUNTER — Other Ambulatory Visit: Payer: Self-pay | Admitting: Internal Medicine

## 2016-08-18 DIAGNOSIS — E781 Pure hyperglyceridemia: Secondary | ICD-10-CM

## 2016-08-18 MED ORDER — FENOFIBRATE 48 MG PO TABS: 48.0000 mg | ORAL_TABLET | Freq: Every day | ORAL | 2 refills | Status: DC

## 2016-08-23 ENCOUNTER — Other Ambulatory Visit: Payer: Self-pay | Admitting: Internal Medicine

## 2016-08-23 MED ORDER — CYCLOBENZAPRINE HCL 10 MG PO TABS: 10.0000 mg | ORAL_TABLET | Freq: Three times a day (TID) | ORAL | 0 refills | Status: DC | PRN

## 2016-10-01 ENCOUNTER — Other Ambulatory Visit: Payer: Self-pay | Admitting: Internal Medicine

## 2016-10-01 DIAGNOSIS — I10 Essential (primary) hypertension: Secondary | ICD-10-CM

## 2016-10-18 IMAGING — CR DG CHEST 2V
3 series · 3 of 3 positions shown · non-contrast
Comparison: None.

CLINICAL DATA: 33-year-old male with left-sided chest trauma with
left chest and rib pain. Initial encounter.

EXAM:
CHEST  2 VIEW

[view not recorded (1 of 3)]
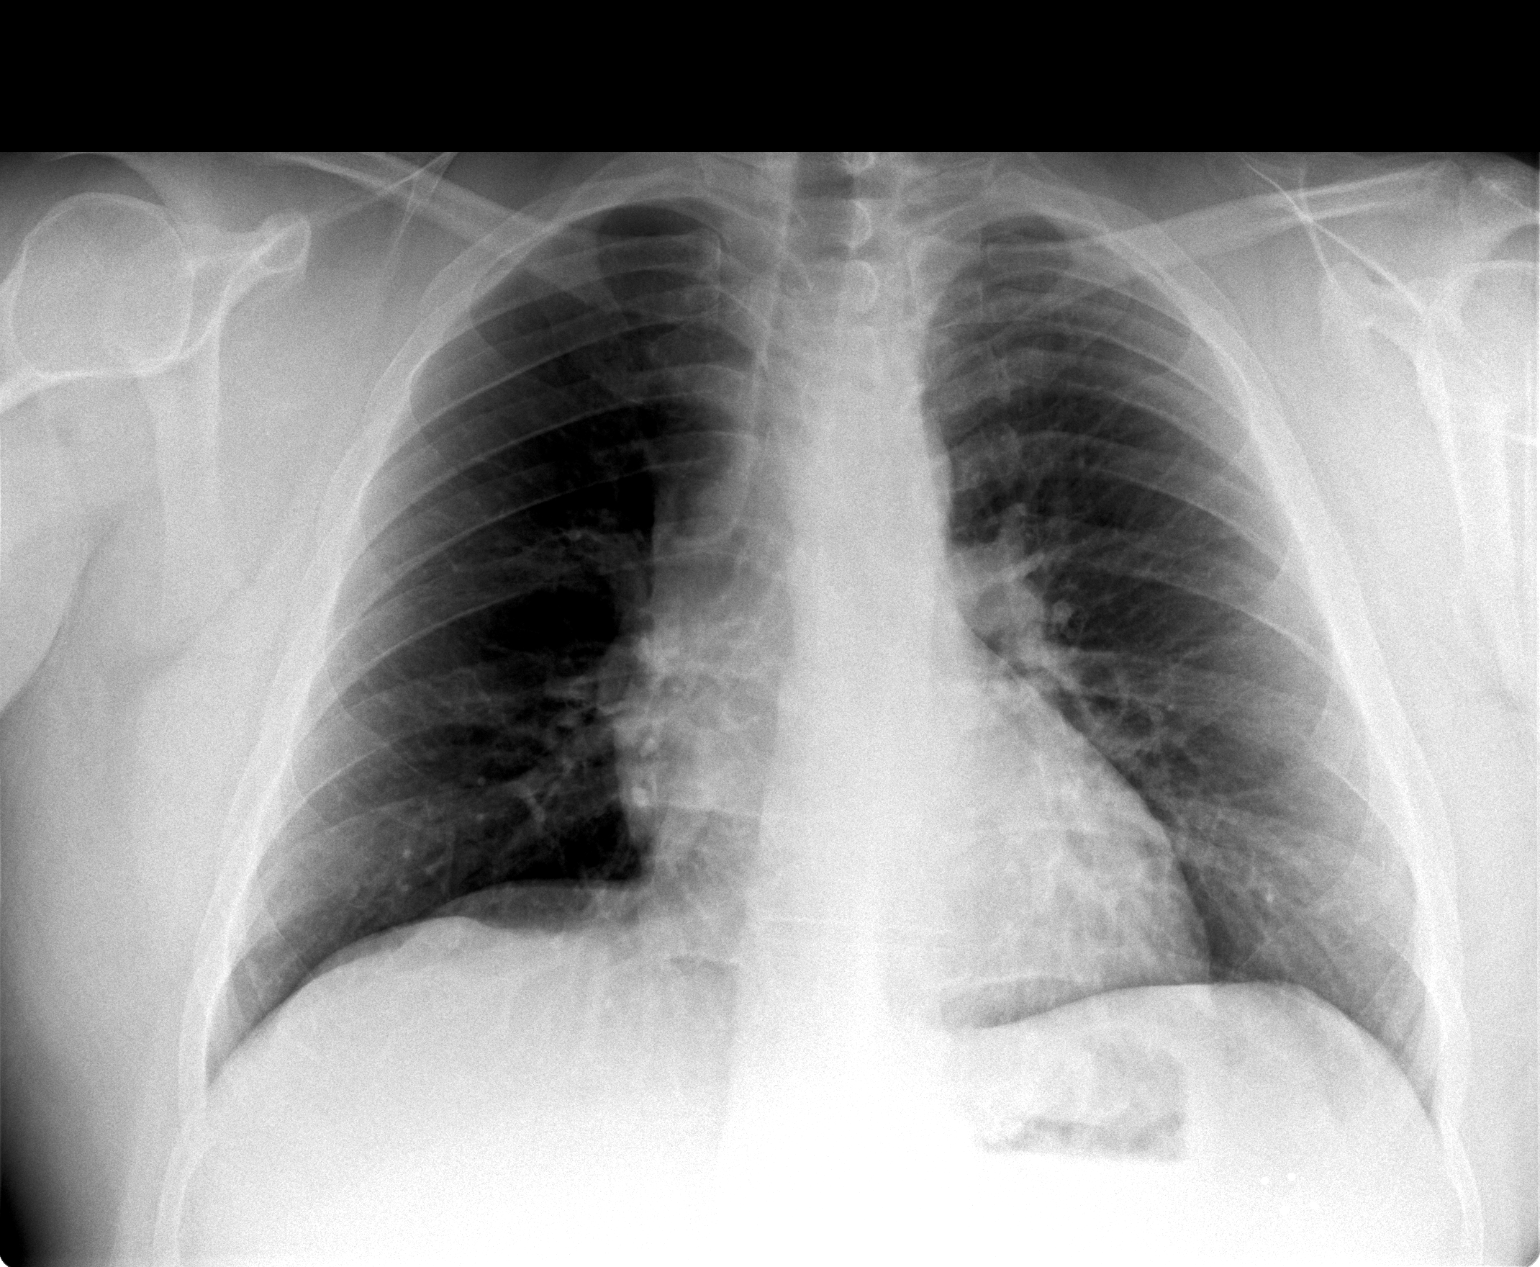

[view not recorded (2 of 3)]
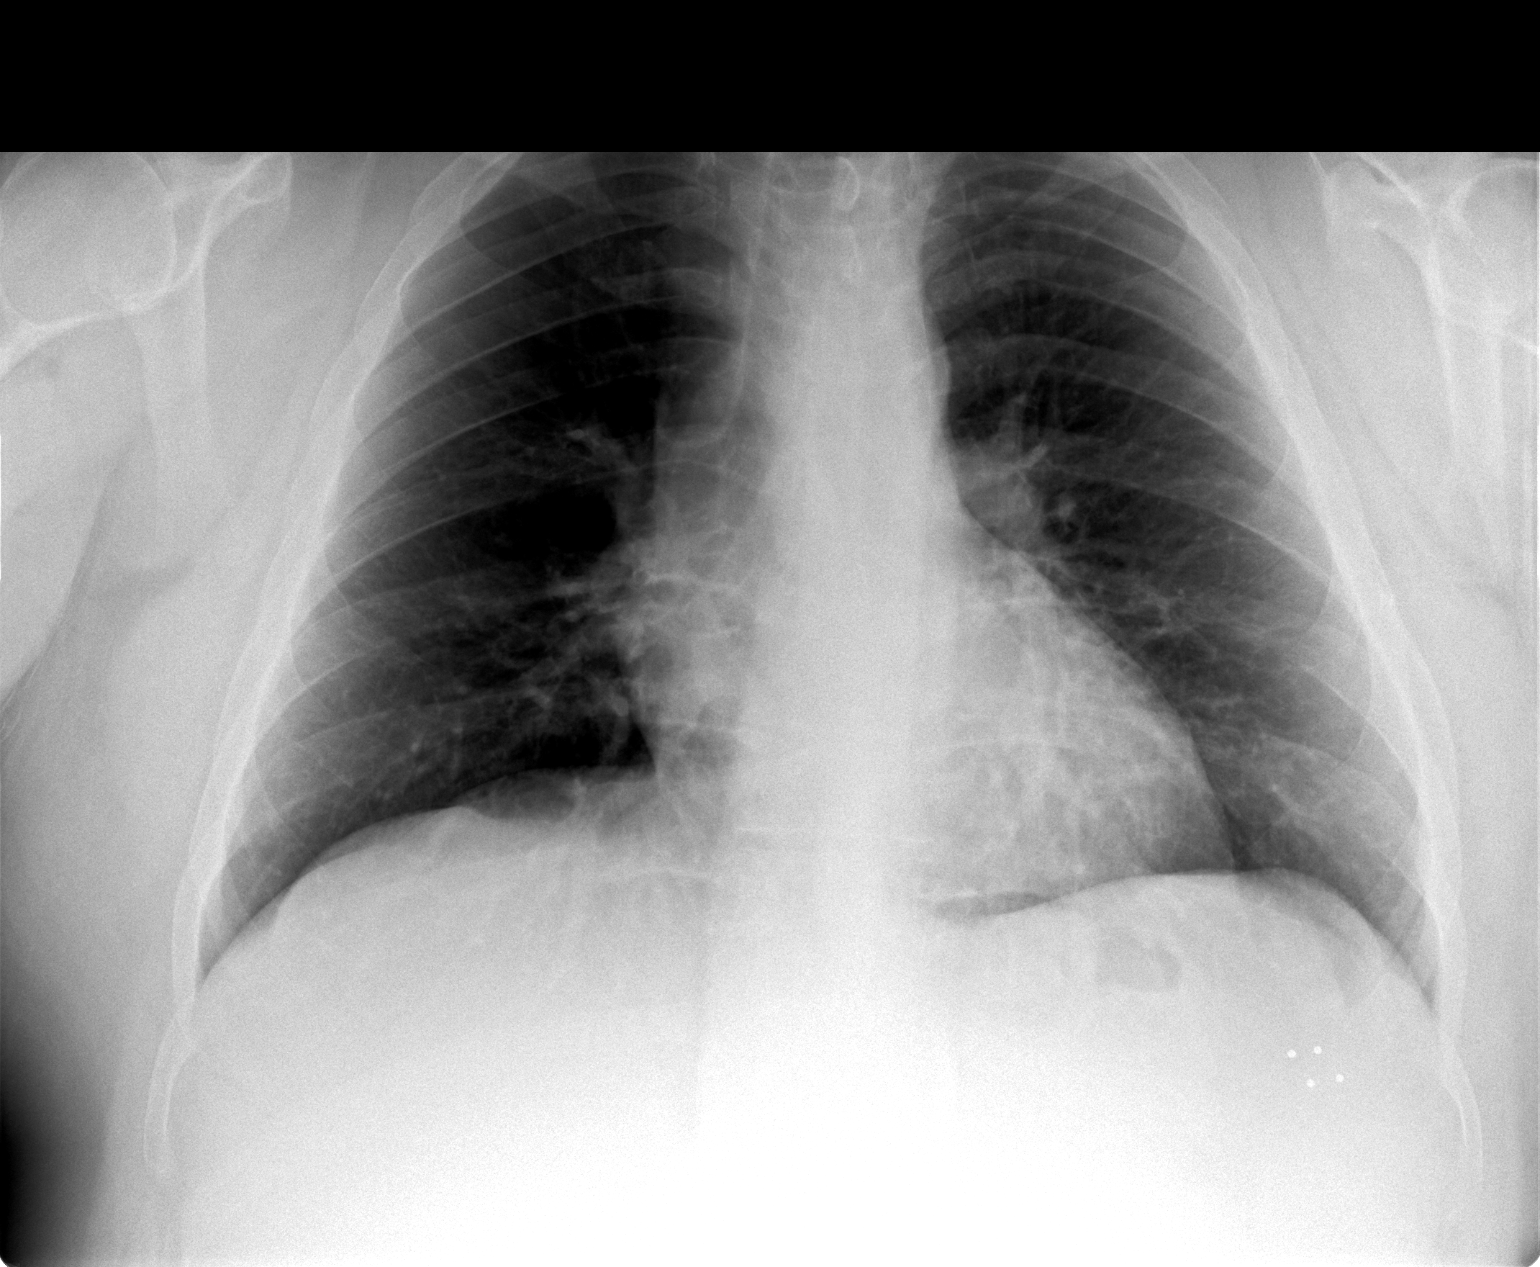

[view not recorded (3 of 3)]
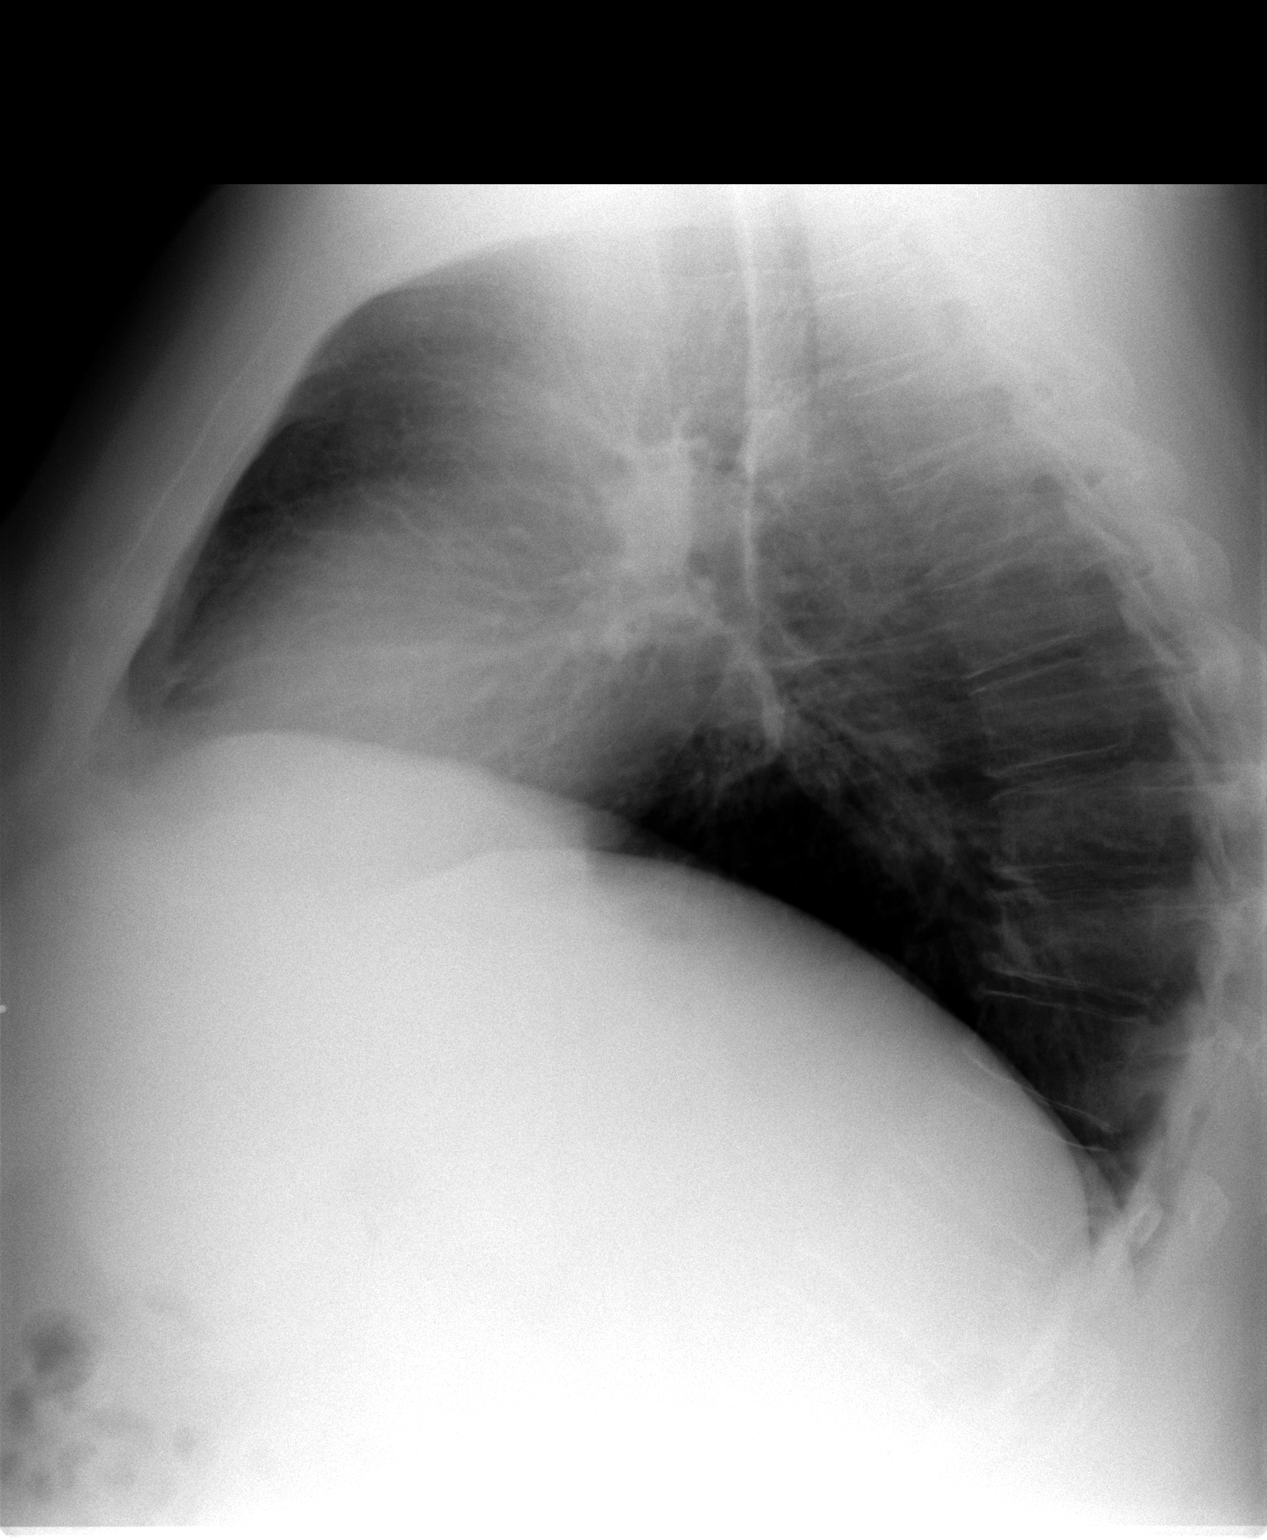

[3 of 3 positions shown; findings below may reference images not displayed]

FINDINGS: The cardiomediastinal silhouette is unremarkable. There is no
evidence of focal airspace disease, pulmonary edema, suspicious
pulmonary nodule/mass, pleural effusion, or pneumothorax. No acute
bony abnormalities are identified.
IMPRESSION: No active cardiopulmonary disease.

## 2016-10-22 ENCOUNTER — Other Ambulatory Visit: Payer: Self-pay

## 2016-10-22 MED ORDER — FENOFIBRATE 48 MG PO TABS: 48.0000 mg | ORAL_TABLET | Freq: Every day | ORAL | 0 refills | Status: DC

## 2016-11-10 ENCOUNTER — Other Ambulatory Visit: Payer: Self-pay | Admitting: Internal Medicine

## 2016-11-15 ENCOUNTER — Other Ambulatory Visit: Payer: BLUE CROSS/BLUE SHIELD

## 2016-11-22 ENCOUNTER — Other Ambulatory Visit (INDEPENDENT_AMBULATORY_CARE_PROVIDER_SITE_OTHER): Payer: BLUE CROSS/BLUE SHIELD

## 2016-11-22 DIAGNOSIS — I10 Essential (primary) hypertension: Secondary | ICD-10-CM

## 2016-11-22 DIAGNOSIS — E781 Pure hyperglyceridemia: Secondary | ICD-10-CM

## 2016-11-22 LAB — LIPID PANEL
CHOL/HDL RATIO: 4
CHOLESTEROL: 113 mg/dL (ref 0–200)
HDL: 30 mg/dL — ABNORMAL LOW (ref >39.00)
LDL CALC: 66 mg/dL (ref 0–99)
NonHDL: 83.44
Triglycerides: 85 mg/dL (ref 0.0–149.0)
VLDL: 17 mg/dL (ref 0.0–40.0)

## 2016-11-23 MED ORDER — AMLODIPINE BESYLATE 10 MG PO TABS: 10.0000 mg | ORAL_TABLET | Freq: Every day | ORAL | 2 refills | Status: DC

## 2016-11-23 MED ORDER — FENOFIBRATE 48 MG PO TABS: 48.0000 mg | ORAL_TABLET | Freq: Every day | ORAL | 2 refills | Status: DC

## 2016-11-23 MED ORDER — LISINOPRIL 20 MG PO TABS: 20.0000 mg | ORAL_TABLET | Freq: Every day | ORAL | 2 refills | Status: DC

## 2016-11-23 NOTE — Addendum Note (Signed)
Addended by: Roena MaladyEVONTENNO, MELANIE Y on: 11/23/2016 10:12 AM   Modules accepted: Orders

## 2017-03-29 ENCOUNTER — Other Ambulatory Visit: Payer: Self-pay | Admitting: Internal Medicine

## 2017-03-29 DIAGNOSIS — I10 Essential (primary) hypertension: Secondary | ICD-10-CM

## 2017-04-15 ENCOUNTER — Ambulatory Visit: Payer: BLUE CROSS/BLUE SHIELD | Admitting: Internal Medicine

## 2017-04-15 ENCOUNTER — Encounter: Payer: Self-pay | Admitting: Internal Medicine

## 2017-04-15 VITALS — BP 130/80 | HR 91 | Temp 98.0°F | Wt 312.0 lb

## 2017-04-15 DIAGNOSIS — G4733 Obstructive sleep apnea (adult) (pediatric): Secondary | ICD-10-CM | POA: Diagnosis not present

## 2017-04-15 DIAGNOSIS — M722 Plantar fascial fibromatosis: Secondary | ICD-10-CM

## 2017-04-15 MED ORDER — TRAMADOL HCL 50 MG PO TABS: 50.0000 mg | ORAL_TABLET | Freq: Three times a day (TID) | ORAL | 0 refills | Status: DC | PRN

## 2017-04-15 MED ORDER — MELOXICAM 15 MG PO TABS: 15.0000 mg | ORAL_TABLET | Freq: Every day | ORAL | 0 refills | Status: DC

## 2017-04-15 MED ORDER — PREDNISONE 10 MG PO TABS: ORAL_TABLET | ORAL | 0 refills | Status: DC

## 2017-04-15 NOTE — Patient Instructions (Signed)

## 2017-04-17 ENCOUNTER — Encounter: Payer: Self-pay | Admitting: Internal Medicine

## 2017-04-17 DIAGNOSIS — G4733 Obstructive sleep apnea (adult) (pediatric): Secondary | ICD-10-CM | POA: Insufficient documentation

## 2017-04-17 NOTE — Progress Notes (Signed)
Subjective:    Patient ID: Anthony Myers, male    DOB: 1980/02/04, 37 y.o.   MRN: [id number]  HPI  Pt presents to the clinic today with c/o right heel pain. This started 3 weeks ago. He describes the pain as sharp and stabbing. The pain is worse first thing in the morning and after sitting for long periods of time. It gets better with ambulation. He has tried foot massage, elevation. He has tried Ibuprofen as needed with minimal relief. He denies any injury to the area.  He also wants to see about going to pulmonology to see about getting a CPAP for his OSA. He reports he is tired all the time and falls asleep any time he sits for more than just a few minutes.  Review of Systems  Past Medical History:  Diagnosis Date  . Anxiety   . Depression   . Insomnia     Current Outpatient Medications  Medication Sig Dispense Refill  . amLODipine (NORVASC) 10 MG tablet Take 1 tablet (10 mg total) by mouth daily. 90 tablet 2  . buPROPion (WELLBUTRIN XL) 150 MG 24 hr tablet Take 1 tablet (150 mg total) by mouth daily. 90 tablet 1  . cyclobenzaprine (FLEXERIL) 10 MG tablet Take 1 tablet (10 mg total) by mouth 3 (three) times daily as needed for muscle spasms. 30 tablet 0  . fenofibrate (TRICOR) 48 MG tablet Take 1 tablet (48 mg total) by mouth daily. 90 tablet 2  . FLUoxetine (PROZAC) 20 MG capsule TAKE 1 CAPSULE (20 MG TOTAL) BY MOUTH DAILY. 90 capsule 1  . lisinopril (PRINIVIL,ZESTRIL) 20 MG tablet TAKE 1 TABLET (20 MG TOTAL) BY MOUTH DAILY. 90 tablet 0  . meloxicam (MOBIC) 15 MG tablet Take 1 tablet (15 mg total) by mouth daily. 30 tablet 0  . predniSONE (DELTASONE) 10 MG tablet Take 3 tabs on days 1-3, take 2 tabs on days 4-6, take 1 tab on days 7-9 18 tablet 0  . traMADol (ULTRAM) 50 MG tablet Take 1 tablet (50 mg total) by mouth every 8 (eight) hours as needed. 15 tablet 0   No current facility-administered medications for this visit.     Allergies  Allergen Reactions  . Tylenol  [Acetaminophen] Swelling    "feels bad"    Family History  Problem Relation Age of Onset  . Mental illness Mother   . Hypertension Father     Social History   Socioeconomic History  . Marital status: Married    Spouse name: Not on file  . Number of children: Not on file  . Years of education: Not on file  . Highest education level: Not on file  Occupational History  . Not on file  Social Needs  . Financial resource strain: Not on file  . Food insecurity:    Worry: Not on file    Inability: Not on file  . Transportation needs:    Medical: Not on file    Non-medical: Not on file  Tobacco Use  . Smoking status: Former Games developer  . Smokeless tobacco: Never Used  Substance and Sexual Activity  . Alcohol use: No    Alcohol/week: 0.0 oz  . Drug use: No  . Sexual activity: Not on file  Lifestyle  . Physical activity:    Days per week: Not on file    Minutes per session: Not on file  . Stress: Not on file  Relationships  . Social connections:    Talks on phone: Not  on file    Gets together: Not on file    Attends religious service: Not on file    Active member of club or organization: Not on file    Attends meetings of clubs or organizations: Not on file    Relationship status: Not on file  . Intimate partner violence:    Fear of current or ex partner: Not on file    Emotionally abused: Not on file    Physically abused: Not on file    Forced sexual activity: Not on file  Other Topics Concern  . Not on file  Social History Narrative  . Not on file     Constitutional: Denies fever, malaise, fatigue, headache or abrupt weight changes.  Musculoskeletal: Pt reports right heel pain Denies decrease in range of motion, difficulty with gait, muscle pain or joint swelling.  Skin: Denies redness, rashes, lesions or ulcercations.    No other specific complaints in a complete review of systems (except as listed in HPI above).     Objective:   Physical Exam   BP 130/80    Pulse 91   Temp 98 F (36.7 C) (Oral)   Wt (!) 312 lb (141.5 kg)   SpO2 98%   BMI 47.44 kg/m  Wt Readings from Last 3 Encounters:  04/15/17 (!) 312 lb (141.5 kg)  08/16/16 (!) 305 lb (138.3 kg)  05/11/16 (!) 309 lb (140.2 kg)    General: Appears his stated age, obese in NAD. Musculoskeletal: Normal flexion, extension and rotation of the right ankle. Pain with palpation over the right heel. Pain noted with ambulation, slight limp to begin with.   BMET    Component Value Date/Time   NA 136 08/16/2016 1607   NA 142 01/14/2013 1416   K 3.9 08/16/2016 1607   K 4.1 01/14/2013 1416   CL 101 08/16/2016 1607   CL 109 (H) 01/14/2013 1416   CO2 26 08/16/2016 1607   CO2 27 01/14/2013 1416   GLUCOSE 102 (H) 08/16/2016 1607   GLUCOSE 113 (H) 01/14/2013 1416   BUN 13 08/16/2016 1607   BUN 11 01/14/2013 1416   CREATININE 0.85 08/16/2016 1607   CREATININE 0.76 03/09/2013 1601   CALCIUM 9.4 08/16/2016 1607   CALCIUM 9.3 01/14/2013 1416   GFRNONAA >60 01/14/2013 1416   GFRAA >60 01/14/2013 1416    Lipid Panel     Component Value Date/Time   CHOL 113 11/22/2016 0858   TRIG 85.0 11/22/2016 0858   HDL 30.00 (L) 11/22/2016 0858   CHOLHDL 4 11/22/2016 0858   VLDL 17.0 11/22/2016 0858   LDLCALC 66 11/22/2016 0858    CBC    Component Value Date/Time   WBC 8.9 08/16/2016 1607   RBC 4.59 08/16/2016 1607   HGB 15.0 08/16/2016 1607   HGB 15.5 01/14/2013 1416   HCT 44.1 08/16/2016 1607   HCT 48.2 01/14/2013 1416   PLT 205.0 08/16/2016 1607   PLT 205 01/14/2013 1416   MCV 96.0 08/16/2016 1607   MCV 85 01/14/2013 1416   MCH 29.7 01/11/2014 1540   MCHC 33.9 08/16/2016 1607   RDW 13.7 08/16/2016 1607   RDW 14.0 01/14/2013 1416   LYMPHSABS 2.6 01/11/2014 1540   MONOABS 0.9 01/11/2014 1540   EOSABS 0.2 01/11/2014 1540   BASOSABS 0.1 01/11/2014 1540    Hgb A1C Lab Results  Component Value Date   HGBA1C 6.1 08/16/2016           Assessment & Plan:   Plantar  Fasciitis,  Right Foot:  eRx for Pred Taper x 9 days eRx for Meloxicam to start taking once you are done with Prednisone eRx for Tramadol for severe pain only Sports med exercises given  If no improvement, consider xray and referral to podiatry  Return precautions discussed Nicki Reaper, NP

## 2017-04-17 NOTE — Assessment & Plan Note (Signed)
Referral placed to pulmonology for further evaluation. 

## 2017-05-02 ENCOUNTER — Ambulatory Visit (INDEPENDENT_AMBULATORY_CARE_PROVIDER_SITE_OTHER): Payer: BLUE CROSS/BLUE SHIELD | Admitting: Internal Medicine

## 2017-05-02 ENCOUNTER — Encounter: Payer: Self-pay | Admitting: Internal Medicine

## 2017-05-02 VITALS — BP 130/82 | HR 92 | Ht 68.0 in | Wt 316.0 lb

## 2017-05-02 DIAGNOSIS — G4719 Other hypersomnia: Secondary | ICD-10-CM | POA: Diagnosis not present

## 2017-05-02 NOTE — Patient Instructions (Signed)
Will send for sleep study.  --Quitting smoking is the most important thing that you can do for your health.  --Quitting smoking will have greater affect on your health than any medicine that we can give you.      Sleep Apnea Sleep apnea is disorder that affects a person's sleep. A person with sleep apnea has abnormal pauses in their breathing when they sleep. It is hard for them to get a good sleep. This makes a person tired during the day. It also can lead to other physical problems. There are three types of sleep apnea. One type is when breathing stops for a short time because your airway is blocked (obstructive sleep apnea). Another type is when the brain sometimes fails to give the normal signal to breathe to the muscles that control your breathing (central sleep apnea). The third type is a combination of the other two types. HOME CARE   Take all medicine as told by your doctor.  Avoid alcohol, calming medicines (sedatives), and depressant drugs.  Try to lose weight if you are overweight. Talk to your doctor about a healthy weight goal.  Your doctor may have you use a device that helps to open your airway. It can help you get the air that you need. It is called a positive airway pressure (PAP) device.   MAKE SURE YOU:   Understand these instructions.  Will watch your condition.  Will get help right away if you are not doing well or get worse.  It may take approximately 1 month for you to get used to wearing her CPAP every night.  Be sure to work with your machine to get used to it, be patient, it may take time! 

## 2017-05-02 NOTE — Addendum Note (Signed)
Addended by: Roena MaladyEVONTENNO, Gabriela Giannelli Y on: 05/02/2017 12:37 PM   Modules accepted: Orders

## 2017-05-02 NOTE — Progress Notes (Signed)
[facility]      Assessment and Plan:  Excessive daytime sleepiness.  -Symptoms and signs of obstructive sleep apnea. -We will send for sleep study.  Nicotine abuse.  -Discussed importance of smoke cessation, spent greater than 3 minutes in discussion.  Allergic rhinitis/bronchitis.  -Has some scattered wheezing today, patient feels that he is recovering from a recent upper respiratory tract infection, and he does not normally wheeze. -We will continue to monitor, consider starting inhaler and/or checking chest x-ray at next visit if symptoms continue.   Date: 05/02/2017  MRN# [id number] Anthony BarriosJeremy Hopple 11/27/1980  Referring Physician:  Dr. Sampson SiBaity.   Anthony BarriosJeremy Mosley is a 37 y.o. old male seen in consultation for chief complaint of:    Chief Complaint  Patient presents with  . Consult    ref by Pamala Hurry. Baity for sleep issues: daytime sleepiness: restless sleep    HPI:   The patient is a 37 year old male, referred for symptoms of excessive daytime sleepiness.  His Epworth score is 15 today.  He usually goes to bed between 930 and 10:30 PM.  He falls asleep quickly usually within seconds.  He gets up out of bed at 4 AM. He feels that he wakes frequently during the night.  He has been tired all day, he often falls asleep and occasionally driving home from work and has to struggle to stay awake.  His weight has increased by more than 50 lbs in past few years.  He is smoking about a ppd. He is thinking about quitting, he has quit in the past but is currently going through a divorce.  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   He is coming off an episode of URTI and has been having allergy symptoms, he takes nasal steroid as needed and mucinex recently.   Denies jaw problems, he has had tonsillectomy in the past, no history of TMJ.     PMHX:   Past Medical History:  Diagnosis Date  . Anxiety   . Depression   .  Insomnia    Surgical Hx:  Past Surgical History:  Procedure Laterality Date  . TONSILLECTOMY     Family Hx:  Family History  Problem Relation Age of Onset  . Mental illness Mother   . Hypertension Father    Social Hx:   Social History   Tobacco Use  . Smoking status: Former Games developermoker  . Smokeless tobacco: Never Used  Substance Use Topics  . Alcohol use: No    Alcohol/week: 0.0 oz  . Drug use: No   Medication:    Current Outpatient Medications:  .  amLODipine (NORVASC) 10 MG tablet, Take 1 tablet (10 mg total) by mouth daily., Disp: 90 tablet, Rfl: 2 .  cyclobenzaprine (FLEXERIL) 10 MG tablet, Take 1 tablet (10 mg total) by mouth 3 (three) times daily as needed for muscle spasms., Disp: 30 tablet, Rfl: 0 .  fenofibrate (TRICOR) 48 MG tablet, Take 1 tablet (48 mg total) by mouth daily., Disp: 90 tablet, Rfl: 2 .  FLUoxetine (PROZAC) 20 MG capsule, TAKE 1 CAPSULE (20 MG TOTAL) BY MOUTH DAILY., Disp: 90 capsule, Rfl: 1 .  lisinopril (PRINIVIL,ZESTRIL) 20 MG tablet, TAKE 1 TABLET (20 MG TOTAL) BY MOUTH DAILY., Disp: 90 tablet, Rfl: 0 .  traMADol (ULTRAM) 50 MG tablet, Take 1 tablet (50 mg total) by mouth every 8 (eight) hours as needed., Disp: 15 tablet, Rfl: 0  Allergies:  Tylenol [acetaminophen]  Review of Systems: Gen:  Denies  fever, sweats, chills HEENT: Denies blurred vision, double vision. bleeds, sore throat Cvc:  No dizziness, chest pain. Resp:   Denies cough or sputum production, shortness of breath Gi: Denies swallowing difficulty, stomach pain. Gu:  Denies bladder incontinence, burning urine Ext:   No Joint pain, stiffness. Skin: No skin rash,  hives  Endoc:  No polyuria, polydipsia. Psych: No depression, insomnia. Other:  All other systems were reviewed with the patient and were negative other that what is mentioned in the HPI.   Physical Examination:   VS: BP 130/82 (BP Location: Left Arm, Cuff Size: Normal)   Pulse 92   Ht 5\' 8"  (1.727 m)   Wt (!) 316  lb (143.3 kg)   SpO2 95%   BMI 48.05 kg/m   General Appearance: No distress  Neuro:without focal findings,  speech normal,  HEENT: PERRLA, EOM intact.  Mallampati 4 Pulmonary: normal breath sounds, scattered wheeze CardiovascularNormal S1,S2.  No m/r/g.   Abdomen: Benign, Soft, non-tender. Renal:  No costovertebral tenderness  GU:  No performed at this time. Endoc: No evident thyromegaly, no signs of acromegaly. Skin:   warm, no rashes, no ecchymosis  Extremities: normal, no cyanosis, clubbing.  Other findings:    LABORATORY PANEL:   CBC No results for input(s): WBC, HGB, HCT, PLT in the last 168 hours. ------------------------------------------------------------------------------------------------------------------  Chemistries  No results for input(s): NA, K, CL, CO2, GLUCOSE, BUN, CREATININE, CALCIUM, MG, AST, ALT, ALKPHOS, BILITOT in the last 168 hours.  Invalid input(s): GFRCGP ------------------------------------------------------------------------------------------------------------------  Cardiac Enzymes No results for input(s): TROPONINI in the last 168 hours. ------------------------------------------------------------  RADIOLOGY:  No results found.     Thank  you for the consultation and for allowing [facility] to assist in the care of your patient. Our recommendations are noted above.  Please contact us if we can be of further service.   Wells Guiles, MD.  Board Certified in Internal Medicine, Pulmonary Medicine, Critical Care Medicine, and Sleep Medicine.  [facility] Office Number: [phone]  Santiago Glad, M.D.  Billy Fischer, M.D  05/02/2017

## 2017-05-12 ENCOUNTER — Other Ambulatory Visit: Payer: Self-pay | Admitting: Internal Medicine

## 2017-05-19 ENCOUNTER — Ambulatory Visit: Payer: BLUE CROSS/BLUE SHIELD | Admitting: Internal Medicine

## 2017-05-30 ENCOUNTER — Encounter: Payer: Self-pay | Admitting: Internal Medicine

## 2017-05-30 DIAGNOSIS — G4719 Other hypersomnia: Secondary | ICD-10-CM

## 2017-05-30 DIAGNOSIS — G4733 Obstructive sleep apnea (adult) (pediatric): Secondary | ICD-10-CM

## 2017-05-31 DIAGNOSIS — G4733 Obstructive sleep apnea (adult) (pediatric): Secondary | ICD-10-CM | POA: Diagnosis not present

## 2017-06-03 ENCOUNTER — Telehealth: Payer: Self-pay | Admitting: *Deleted

## 2017-06-03 DIAGNOSIS — G4733 Obstructive sleep apnea (adult) (pediatric): Secondary | ICD-10-CM

## 2017-06-03 NOTE — Telephone Encounter (Signed)
Recommend in-lab CPAP titration. Orders placed Nothing further needed.

## 2017-06-14 ENCOUNTER — Telehealth: Payer: Self-pay | Admitting: Internal Medicine

## 2017-06-14 NOTE — Telephone Encounter (Signed)
Pt calling to schedule Sleep study   Please call back

## 2017-06-14 NOTE — Telephone Encounter (Signed)
Pt needed to contact Sleep Med. Provided patient the phone number to Sleep Med [phone] opt 4 to reach [facility], who will schedule the study. Pt voiced understanding and stated he would contact her now. Nothing else needed at this time. Rhonda J Cobb

## 2017-06-15 ENCOUNTER — Other Ambulatory Visit: Payer: Self-pay | Admitting: Internal Medicine

## 2017-06-23 ENCOUNTER — Ambulatory Visit: Payer: BLUE CROSS/BLUE SHIELD | Attending: Internal Medicine

## 2017-06-23 DIAGNOSIS — G4761 Periodic limb movement disorder: Secondary | ICD-10-CM | POA: Diagnosis not present

## 2017-06-23 DIAGNOSIS — G4733 Obstructive sleep apnea (adult) (pediatric): Secondary | ICD-10-CM | POA: Diagnosis not present

## 2017-06-23 DIAGNOSIS — I10 Essential (primary) hypertension: Secondary | ICD-10-CM | POA: Insufficient documentation

## 2017-06-28 DIAGNOSIS — G4733 Obstructive sleep apnea (adult) (pediatric): Secondary | ICD-10-CM | POA: Diagnosis not present

## 2017-06-29 ENCOUNTER — Telehealth: Payer: Self-pay | Admitting: *Deleted

## 2017-06-29 ENCOUNTER — Telehealth: Payer: Self-pay | Admitting: Internal Medicine

## 2017-06-29 DIAGNOSIS — G4733 Obstructive sleep apnea (adult) (pediatric): Secondary | ICD-10-CM

## 2017-06-29 NOTE — Telephone Encounter (Signed)
Informed pt that we have not received the CPAP titration results yet which is what he is asking for. Informed him once we receive the results we will call him and he can pick up a copy. Nothing further at this time.

## 2017-06-29 NOTE — Telephone Encounter (Signed)
Recommendation: Auto Cpap with pressure range of 14-20 cmH20  Weight loss.  Patient does not have vmail box setup. Patient had called earlier for results.

## 2017-06-29 NOTE — Telephone Encounter (Signed)
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

## 2017-06-30 ENCOUNTER — Encounter: Payer: Self-pay | Admitting: Internal Medicine

## 2017-06-30 ENCOUNTER — Ambulatory Visit: Payer: BLUE CROSS/BLUE SHIELD | Admitting: Internal Medicine

## 2017-06-30 VITALS — BP 130/80 | HR 84 | Temp 98.1°F | Wt 324.0 lb

## 2017-06-30 DIAGNOSIS — E119 Type 2 diabetes mellitus without complications: Secondary | ICD-10-CM

## 2017-06-30 DIAGNOSIS — R739 Hyperglycemia, unspecified: Secondary | ICD-10-CM | POA: Diagnosis not present

## 2017-06-30 LAB — POCT GLYCOSYLATED HEMOGLOBIN (HGB A1C): HEMOGLOBIN A1C: 8.3 % — AB (ref 4.0–5.6)

## 2017-06-30 MED ORDER — GLIPIZIDE 5 MG PO TABS: 5.0000 mg | ORAL_TABLET | Freq: Two times a day (BID) | ORAL | 2 refills | Status: DC

## 2017-06-30 NOTE — Telephone Encounter (Signed)
Patient aware of results. Orders have been placed. 

## 2017-06-30 NOTE — Patient Instructions (Signed)
Diabetes Mellitus and Standards of Medical Care Managing diabetes (diabetes mellitus) can be complicated. Your diabetes treatment may be managed by a team of health care providers, including:  A diet and nutrition specialist (registered dietitian).  A nurse.  A certified diabetes educator (CDE).  A diabetes specialist (endocrinologist).  An eye doctor.  A primary care provider.  A dentist.  Your health care providers follow a schedule in order to help you get the best quality of care. The following schedule is a general guideline for your diabetes management plan. Your health care providers may also give you more specific instructions. HbA1c ( hemoglobin A1c) test This test provides information about blood sugar (glucose) control over the previous 2-3 months. It is used to check whether your diabetes management plan needs to be adjusted.  If you are meeting your treatment goals, this test is done at least 2 times a year.  If you are not meeting treatment goals or if your treatment goals have changed, this test is done 4 times a year.  Blood pressure test  This test is done at every routine medical visit. For most people, the goal is less than 130/80. Ask your health care provider what your goal blood pressure should be. Dental and eye exams  Visit your dentist two times a year.  If you have type 1 diabetes, get an eye exam 3-5 years after you are diagnosed, and then once a year after your first exam. ? If you were diagnosed with type 1 diabetes as a child, get an eye exam when you are age 87 or older and have had diabetes for 3-5 years. After the first exam, you should get an eye exam once a year.  If you have type 2 diabetes, have an eye exam as soon as you are diagnosed, and then once a year after your first exam. Foot care exam  Visual foot exams are done at every routine medical visit. The exams check for cuts, bruises, redness, blisters, sores, or other problems with the  feet.  A complete foot exam is done by your health care provider once a year. This exam includes an inspection of the structure and skin of your feet, and a check of the pulses and sensation in your feet. ? Type 1 diabetes: Get your first exam 3-5 years after diagnosis. ? Type 2 diabetes: Get your first exam as soon as you are diagnosed.  Check your feet every day for cuts, bruises, redness, blisters, or sores. If you have any of these or other problems that are not healing, contact your health care provider. Kidney function test ( urine microalbumin)  This test is done once a year. ? Type 1 diabetes: Get your first test 5 years after diagnosis. ? Type 2 diabetes: Get your first test as soon as you are diagnosed.  If you have chronic kidney disease (CKD), get a serum creatinine and estimated glomerular filtration rate (eGFR) test once a year. Lipid profile (cholesterol, HDL, LDL, triglycerides)  This test should be done when you are diagnosed with diabetes, and every 5 years after the first test. If you are on medicines to lower your cholesterol, you may need to get this test done every year. ? The goal for LDL is less than 100 mg/dL (5.5 mmol/L). If you are at high risk, the goal is less than 70 mg/dL (3.9 mmol/L). ? The goal for HDL is 40 mg/dL (2.2 mmol/L) for men and 50 mg/dL(2.8 mmol/L) for women. An HDL  cholesterol of 60 mg/dL (3.3 mmol/L) or higher gives some protection against heart disease. ? The goal for triglycerides is less than 150 mg/dL (8.3 mmol/L). Immunizations  The yearly flu (influenza) vaccine is recommended for everyone 6 months or older who has diabetes.  The pneumonia (pneumococcal) vaccine is recommended for everyone 2 years or older who has diabetes. If you are 18 or older, you may get the pneumonia vaccine as a series of two separate shots.  The hepatitis B vaccine is recommended for adults shortly after they have been diagnosed with diabetes.  The Tdap  (tetanus, diphtheria, and pertussis) vaccine should be given: ? According to normal childhood vaccination schedules, for children. ? Every 10 years, for adults who have diabetes.  The shingles vaccine is recommended for people who have had chicken pox and are 50 years or older. Mental and emotional health  Screening for symptoms of eating disorders, anxiety, and depression is recommended at the time of diagnosis and afterward as needed. If your screening shows that you have symptoms (you have a positive screening result), you may need further evaluation and be referred to a mental health care provider. Diabetes self-management education  Education about how to manage your diabetes is recommended at diagnosis and ongoing as needed. Treatment plan  Your treatment plan will be reviewed at every medical visit. Summary  Managing diabetes (diabetes mellitus) can be complicated. Your diabetes treatment may be managed by a team of health care providers.  Your health care providers follow a schedule in order to help you get the best quality of care.  Standards of care including having regular physical exams, blood tests, blood pressure monitoring, immunizations, screening tests, and education about how to manage your diabetes.  Your health care providers may also give you more specific instructions based on your individual health. This information is not intended to replace advice given to you by your health care provider. Make sure you discuss any questions you have with your health care provider. Document Released: 11/08/2008 Document Revised: 10/10/2015 Document Reviewed: 10/10/2015 Elsevier Interactive Patient Education  [employer].

## 2017-07-06 ENCOUNTER — Encounter: Payer: Self-pay | Admitting: Internal Medicine

## 2017-07-06 DIAGNOSIS — E119 Type 2 diabetes mellitus without complications: Secondary | ICD-10-CM | POA: Insufficient documentation

## 2017-07-06 NOTE — Progress Notes (Signed)
Subjective:    Patient ID: Anthony Myers, male    DOB: 1980/03/17, 37 y.o.   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 He reports he was told he had protein and sugar in his urine. They are requesting an A1C to r/o diabetes. They also need a letter stating that he is no longer taking Tramadol and that his depression is controlled on Fluoxetine.  Review of Systems      Past Medical History:  Diagnosis Date  . Anxiety   . Depression   . Insomnia     Current Outpatient Medications  Medication Sig Dispense Refill  . amLODipine (NORVASC) 10 MG tablet Take 1 tablet (10 mg total) by mouth daily. 90 tablet 2  . cyclobenzaprine (FLEXERIL) 10 MG tablet Take 1 tablet (10 mg total) by mouth 3 (three) times daily as needed for muscle spasms. 30 tablet 0  . fenofibrate (TRICOR) 48 MG tablet Take 1 tablet (48 mg total) by mouth daily. 90 tablet 2  . FLUoxetine (PROZAC) 20 MG capsule TAKE 1 CAPSULE (20 MG TOTAL) BY MOUTH DAILY. 90 capsule 0  . lisinopril (PRINIVIL,ZESTRIL) 20 MG tablet TAKE 1 TABLET (20 MG TOTAL) BY MOUTH DAILY. 90 tablet 0  . meloxicam (MOBIC) 15 MG tablet TAKE 1 TABLET BY MOUTH EVERY DAY 30 tablet 0  . glipiZIDE (GLUCOTROL) 5 MG tablet Take 1 tablet (5 mg total) by mouth 2 (two) times daily before a meal. 60 tablet 2   No current facility-administered medications for this visit.     Allergies  Allergen Reactions  . Tylenol [Acetaminophen] Swelling    "feels bad"    Family History  Problem Relation Age of Onset  . Mental illness Mother   . Hypertension Father     Social History   Socioeconomic History  . Marital status: Married    Spouse name: Not on file  . Number of children: Not on file  . Years of education: Not on file  . Highest education level: Not on file  Occupational History  . Not on file  Social Needs  . Financial resource strain: Not on file  . Food insecurity:    Worry: Not on file    Inability: Not  on file  . Transportation needs:    Medical: Not on file    Non-medical: Not on file  Tobacco Use  . Smoking status: Former Games developer  . Smokeless tobacco: Never Used  Substance and Sexual Activity  . Alcohol use: No    Alcohol/week: 0.0 oz  . Drug use: No  . Sexual activity: Not on file  Lifestyle  . Physical activity:    Days per week: Not on file    Minutes per session: Not on file  . Stress: Not on file  Relationships  . Social connections:    Talks on phone: Not on file    Gets together: Not on file    Attends religious service: Not on file    Active member of club or organization: Not on file    Attends meetings of clubs or organizations: Not on file    Relationship status: Not on file  . Intimate partner violence:    Fear of current or ex partner: Not on file    Emotionally abused: Not on file    Physically abused: Not on file    Forced sexual activity: Not on file  Other Topics Concern  . Not on file  Social History Narrative  . Not on file     Constitutional: Pt reports fatigue. Denies fever, malaise, headache or abrupt weight changes.  Respiratory: Denies difficulty breathing, shortness of breath, cough or sputum production.   Cardiovascular: Denies chest pain, chest tightness, palpitations or swelling in the hands or feet.  Gastrointestinal: Pt reports increased thirst. Denies abdominal pain, bloating, constipation, diarrhea or blood in the stool.  GU: Denies urgency, frequency, pain with urination, burning sensation, blood in urine, odor or discharge. Skin: Denies redness, rashes, lesions or ulcercations.  Neurological: Denies dizziness, difficulty with memory, difficulty with speech or problems with balance and coordination.  Psych: Pt has a history of depression. Denies anxiety, SI/HI.  No other specific complaints in a complete review of systems (except as listed in HPI above).  Objective:   Physical Exam   BP 130/80   Pulse 84   Temp 98.1 F (36.7  C) (Oral)   Wt (!) 324 lb (147 kg)   SpO2 98%   BMI 49.26 kg/m  Wt Readings from Last 3 Encounters:  06/30/17 (!) 324 lb (147 kg)  05/02/17 (!) 316 lb (143.3 kg)  04/15/17 (!) 312 lb (141.5 kg)    General: Appears his stated age, obese in NAD. Skin: Warm, dry and intact. No ulcerations noted. Cardiovascular: Normal rate and rhythm. S1,S2 noted.  No murmur, rubs or gallops noted.  Pulmonary/Chest: Normal effort and positive vesicular breath sounds. No respiratory distress. No wheezes, rales or ronchi noted.  Neurological: Alert and oriented. Sensation intact to BUE. Psychiatric: Mood and affect normal. Behavior is normal. Judgment and thought content normal.   EK  BMET    Component Value Date/Time   NA 136 08/16/2016 1607   NA 142 01/14/2013 1416   K 3.9 08/16/2016 1607   K 4.1 01/14/2013 1416   CL 101 08/16/2016 1607   CL 109 (H) 01/14/2013 1416   CO2 26 08/16/2016 1607   CO2 27 01/14/2013 1416   GLUCOSE 102 (H) 08/16/2016 1607   GLUCOSE 113 (H) 01/14/2013 1416   BUN 13 08/16/2016 1607   BUN 11 01/14/2013 1416   CREATININE 0.85 08/16/2016 1607   CREATININE 0.76 03/09/2013 1601   CALCIUM 9.4 08/16/2016 1607   CALCIUM 9.3 01/14/2013 1416   GFRNONAA >60 01/14/2013 1416   GFRAA >60 01/14/2013 1416    Lipid Panel     Component Value Date/Time   CHOL 113 11/22/2016 0858   TRIG 85.0 11/22/2016 0858   HDL 30.00 (L) 11/22/2016 0858   CHOLHDL 4 11/22/2016 0858   VLDL 17.0 11/22/2016 0858   LDLCALC 66 11/22/2016 0858    CBC    Component Value Date/Time   WBC 8.9 08/16/2016 1607   RBC 4.59 08/16/2016 1607   HGB 15.0 08/16/2016 1607   HGB 15.5 01/14/2013 1416   HCT 44.1 08/16/2016 1607   HCT 48.2 01/14/2013 1416   PLT 205.0 08/16/2016 1607   PLT 205 01/14/2013 1416   MCV 96.0 08/16/2016 1607   MCV 85 01/14/2013 1416   MCH 29.7 01/11/2014 1540   MCHC 33.9 08/16/2016 1607   RDW 13.7 08/16/2016 1607   RDW 14.0 01/14/2013 1416   LYMPHSABS 2.6 01/11/2014 1540    MONOABS 0.9 01/11/2014 1540   EOSABS 0.2 01/11/2014 1540   BASOSABS 0.1 01/11/2014 1540    Hgb A1C Lab Results  Component Value Date   HGBA1C 8.3 (A) 06/30/2017           Assessment & Plan:

## 2017-07-06 NOTE — Assessment & Plan Note (Signed)
New onset Discussed low carb diet and exercise for weight loss Will start Glipizide 5 mg BID Foot exam today Encouraged yearly eye exams Discussed need for yearly flu vaccine and pneumovax every 5 years No microalbumin secondary to ACEI therapy He declines referral to diabetes education and nutrition at this time  RTC in 3 months for repeat A1C

## 2017-08-08 ENCOUNTER — Encounter: Payer: Self-pay | Admitting: Internal Medicine

## 2017-09-09 NOTE — Progress Notes (Signed)
[facility]      Assessment and Plan:  Obstructive sleep apnea. -Severe with AHI of 70.  Currently on AutoPap pressure range 14-20 cmH2O. -Doing better with CPAP, with excellent control of sleep apnea, however compliance is suboptimal, encouraged greater use of CPAP.  Nicotine abuse.  -Discussed importance of smoke cessation, spent greater than 3 minutes in discussion. - Currently is not interested in nicotine replacement, would like to quit on his own, but he is currently in the process of losing weight and would like to go a little bit further with that before trying to quit again.  Allergic rhinitis/bronchitis.  -Was likely due to post-viral, now doing better and respiratory issues have resolved.    Date: 09/09/2017  MRN# [id number] Anthony Myers 06/07/1980  Referring Physician:  Dr. Sampson SiBaity.   Anthony Myers is a 37 y.o. old male seen in consultation for chief complaint of:    Chief Complaint  Patient presents with  . Follow-up    pt states he loves cpap therapy, he is doing well. He feels so much better.  . Sleep Apnea    HPI:  The patient is a 37 year old male, last visit he was sent for sleep study for symptoms of obstructive sleep apnea.  This shows severe obstructive sleep apnea he was started on AutoPap.  He has been feeling much better during the day, he is no longer sleepy, not snoring, he is waking up feeling more refreshed.  He continues to smoking about a ppd, he is thinking of quitting once he loses more weight. He has quit in the past on his own.   Denies jaw problems, he has had tonsillectomy in the past, no history of TMJ.   **Download data 08/13/2017-09/11/2017>> raw data personally reviewed, usage greater than 4 hours is 13/30 days.  Average usage on days used is 3 hours 59 minutes.  Pressure is auto 14-20.  Median pressure 15, 93 percentile pressure 17, maximum pressure 18.  Residual AHI is 1.  Overall this test shows an adequate  compliance with CPAP with excellent control of obstructive sleep apnea when used. **CPAP titration 06/27/2017>> recommended auto CPAP with pressure range 14-20. **Home sleep test 05/30/2017>> severe obstructive sleep apnea with AHI 70.     Medication:    Current Outpatient Medications:  .  amLODipine (NORVASC) 10 MG tablet, Take 1 tablet (10 mg total) by mouth daily., Disp: 90 tablet, Rfl: 2 .  cyclobenzaprine (FLEXERIL) 10 MG tablet, Take 1 tablet (10 mg total) by mouth 3 (three) times daily as needed for muscle spasms., Disp: 30 tablet, Rfl: 0 .  fenofibrate (TRICOR) 48 MG tablet, Take 1 tablet (48 mg total) by mouth daily., Disp: 90 tablet, Rfl: 2 .  FLUoxetine (PROZAC) 20 MG capsule, TAKE 1 CAPSULE (20 MG TOTAL) BY MOUTH DAILY., Disp: 90 capsule, Rfl: 0 .  glipiZIDE (GLUCOTROL) 5 MG tablet, Take 1 tablet (5 mg total) by mouth 2 (two) times daily before a meal., Disp: 60 tablet, Rfl: 2 .  lisinopril (PRINIVIL,ZESTRIL) 20 MG tablet, TAKE 1 TABLET (20 MG TOTAL) BY MOUTH DAILY., Disp: 90 tablet, Rfl: 0 .  meloxicam (MOBIC) 15 MG tablet, TAKE 1 TABLET BY MOUTH EVERY DAY, Disp: 30 tablet, Rfl: 0   Allergies:  Tylenol [acetaminophen]  Review of Systems:  Constitutional: Feels well. Cardiovascular: No chest pain.  Pulmonary: Denies hemoptysis. The remainder of systems were reviewed and were found to be negative other than what is documented in the HPI.  Physical Examination:   VS: BP 128/82 (BP Location: Left Arm, Cuff Size: Large)   Pulse 87   Resp 16   Ht 5\' 8"  (1.727 m)   Wt 293 lb (132.9 kg)   SpO2 97%   BMI 44.55 kg/m   General Appearance: No distress  Neuro:without focal findings, mental status, speech normal, alert and oriented HEENT: PERRLA, EOM intact Pulmonary: No wheezing, No rales  CardiovascularNormal S1,S2.  No m/r/g.  Abdomen: Benign, Soft, non-tender, No masses Renal:  No costovertebral tenderness  GU:  No performed at this time. Endoc: No evident thyromegaly,  no signs of acromegaly or Cushing features Skin:   warm, no rashes, no ecchymosis  Extremities: normal, no cyanosis, clubbing.       LABORATORY PANEL:   CBC No results for input(s): WBC, HGB, HCT, PLT in the last 168 hours. ------------------------------------------------------------------------------------------------------------------  Chemistries  No results for input(s): NA, K, CL, CO2, GLUCOSE, BUN, CREATININE, CALCIUM, MG, AST, ALT, ALKPHOS, BILITOT in the last 168 hours.  Invalid input(s): GFRCGP ------------------------------------------------------------------------------------------------------------------  Cardiac Enzymes No results for input(s): TROPONINI in the last 168 hours. ------------------------------------------------------------  RADIOLOGY:  No results found.     Thank  you for the consultation and for allowing [facility] to assist in the care of your patient. Our recommendations are noted above.  Please contact us if we can be of further service.  Wells Guileseep Kery Batzel, M.D., F.C.C.P.  Board Certified in Internal Medicine, Pulmonary Medicine, Critical Care Medicine, and Sleep Medicine.  [facility] Office Number: [phone]   09/09/2017

## 2017-09-11 ENCOUNTER — Encounter: Payer: Self-pay | Admitting: Internal Medicine

## 2017-09-12 ENCOUNTER — Encounter: Payer: Self-pay | Admitting: Internal Medicine

## 2017-09-12 ENCOUNTER — Ambulatory Visit: Payer: BLUE CROSS/BLUE SHIELD | Admitting: Internal Medicine

## 2017-09-12 VITALS — BP 128/82 | HR 87 | Resp 16 | Ht 68.0 in | Wt 293.0 lb

## 2017-09-12 DIAGNOSIS — Z720 Tobacco use: Secondary | ICD-10-CM | POA: Diagnosis not present

## 2017-09-12 DIAGNOSIS — G4733 Obstructive sleep apnea (adult) (pediatric): Secondary | ICD-10-CM

## 2017-09-12 NOTE — Patient Instructions (Signed)
Use CPAP whenever you sleep.   --Quitting smoking is the most important thing that you can do for your health.  --Quitting smoking will have greater affect on your health than any medicine that we can give you.

## 2017-09-22 ENCOUNTER — Other Ambulatory Visit: Payer: Self-pay | Admitting: Internal Medicine

## 2017-09-23 ENCOUNTER — Other Ambulatory Visit: Payer: Self-pay | Admitting: Internal Medicine

## 2017-09-28 ENCOUNTER — Ambulatory Visit: Payer: BLUE CROSS/BLUE SHIELD | Admitting: Internal Medicine

## 2017-09-30 ENCOUNTER — Ambulatory Visit: Payer: BLUE CROSS/BLUE SHIELD | Admitting: Internal Medicine

## 2017-09-30 ENCOUNTER — Encounter: Payer: Self-pay | Admitting: Internal Medicine

## 2017-09-30 VITALS — BP 122/76 | HR 77 | Temp 98.8°F | Wt 289.0 lb

## 2017-09-30 DIAGNOSIS — E119 Type 2 diabetes mellitus without complications: Secondary | ICD-10-CM | POA: Diagnosis not present

## 2017-09-30 LAB — POCT GLYCOSYLATED HEMOGLOBIN (HGB A1C): Hemoglobin A1C: 5.6 % (ref 4.0–5.6)

## 2017-09-30 MED ORDER — MELOXICAM 15 MG PO TABS: 15.0000 mg | ORAL_TABLET | Freq: Every day | ORAL | 2 refills | Status: DC

## 2017-09-30 NOTE — Progress Notes (Signed)
Subjective:    Patient ID: Anthony Myers, male    DOB: 1980-07-13, 37 y.o.   MRN: [id number]  HPI  Pt presents to the clinic today for 3 month follow up DM 2. At his last visit, his A1C was 8.3%, 06/2017. This was a new diagnoses of diabetes for him. He was started on Glipizide 5 mg BID. He has been working on low carb diet and exercise for weight loss. He has lost 35 lbs. He does not check his sugars. His last eye exam was > 1 year ago. He does not check his feet routinely. He is on Lisinopril for renal protection.   Review of Systems      Past Medical History:  Diagnosis Date  . Anxiety   . Depression   . Insomnia     Current Outpatient Medications  Medication Sig Dispense Refill  . amLODipine (NORVASC) 10 MG tablet Take 1 tablet (10 mg total) by mouth daily. 90 tablet 2  . fenofibrate (TRICOR) 48 MG tablet Take 1 tablet (48 mg total) by mouth daily. 90 tablet 2  . FLUoxetine (PROZAC) 20 MG capsule TAKE 1 CAPSULE (20 MG TOTAL) BY MOUTH DAILY. 90 capsule 0  . glipiZIDE (GLUCOTROL) 5 MG tablet Take 1 tablet (5 mg total) by mouth 2 (two) times daily before a meal. 60 tablet 2  . lisinopril (PRINIVIL,ZESTRIL) 20 MG tablet TAKE 1 TABLET (20 MG TOTAL) BY MOUTH DAILY. 90 tablet 0  . meloxicam (MOBIC) 15 MG tablet Take 1 tablet (15 mg total) by mouth daily. 30 tablet 2   No current facility-administered medications for this visit.     Allergies  Allergen Reactions  . Tylenol [Acetaminophen] Swelling    "feels bad"    Family History  Problem Relation Age of Onset  . Mental illness Mother   . Hypertension Father     Social History   Socioeconomic History  . Marital status: Married    Spouse name: Not on file  . Number of children: Not on file  . Years of education: Not on file  . Highest education level: Not on file  Occupational History  . Not on file  Social Needs  . Financial resource strain: Not on file  . Food insecurity:    Worry: Not on file    Inability: Not  on file  . Transportation needs:    Medical: Not on file    Non-medical: Not on file  Tobacco Use  . Smoking status: Former Games developer  . Smokeless tobacco: Never Used  Substance and Sexual Activity  . Alcohol use: No    Alcohol/week: 0.0 standard drinks  . Drug use: No  . Sexual activity: Not on file  Lifestyle  . Physical activity:    Days per week: Not on file    Minutes per session: Not on file  . Stress: Not on file  Relationships  . Social connections:    Talks on phone: Not on file    Gets together: Not on file    Attends religious service: Not on file    Active member of club or organization: Not on file    Attends meetings of clubs or organizations: Not on file    Relationship status: Not on file  . Intimate partner violence:    Fear of current or ex partner: Not on file    Emotionally abused: Not on file    Physically abused: Not on file    Forced sexual activity: Not on file  Other Topics Concern  . Not on file  Social History Narrative  . Not on file     Constitutional: Denies fever, malaise, fatigue, headache or abrupt weight changes.  Respiratory: Denies difficulty breathing, shortness of breath, cough or sputum production.   Cardiovascular: Denies chest pain, chest tightness, palpitations or swelling in the hands or feet.  Skin: Denies redness, rashes, lesions or ulcercations.    No other specific complaints in a complete review of systems (except as listed in HPI above).  Objective:   Physical Exam   BP 122/76   Pulse 77   Temp 98.8 F (37.1 C) (Oral)   Wt 289 lb (131.1 kg)   SpO2 97%   BMI 43.94 kg/m  Wt Readings from Last 3 Encounters:  09/30/17 289 lb (131.1 kg)  09/12/17 293 lb (132.9 kg)  06/30/17 (!) 324 lb (147 kg)    General: Appears her stated age, obese in NAD. Skin: Warm, dry and intact. No ulcerations noted. Cardiovascular: Normal rate and rhythm. S1,S2 noted.  No murmur, rubs or gallops noted.  Pulmonary/Chest: Normal effort  and positive vesicular breath sounds. No respiratory distress. No wheezes, rales or ronchi noted.  Neurological: Alert and oriented.   BMET    Component Value Date/Time   NA 136 08/16/2016 1607   NA 142 01/14/2013 1416   K 3.9 08/16/2016 1607   K 4.1 01/14/2013 1416   CL 101 08/16/2016 1607   CL 109 (H) 01/14/2013 1416   CO2 26 08/16/2016 1607   CO2 27 01/14/2013 1416   GLUCOSE 102 (H) 08/16/2016 1607   GLUCOSE 113 (H) 01/14/2013 1416   BUN 13 08/16/2016 1607   BUN 11 01/14/2013 1416   CREATININE 0.85 08/16/2016 1607   CREATININE 0.76 03/09/2013 1601   CALCIUM 9.4 08/16/2016 1607   CALCIUM 9.3 01/14/2013 1416   GFRNONAA >60 01/14/2013 1416   GFRAA >60 01/14/2013 1416    Lipid Panel     Component Value Date/Time   CHOL 113 11/22/2016 0858   TRIG 85.0 11/22/2016 0858   HDL 30.00 (L) 11/22/2016 0858   CHOLHDL 4 11/22/2016 0858   VLDL 17.0 11/22/2016 0858   LDLCALC 66 11/22/2016 0858    CBC    Component Value Date/Time   WBC 8.9 08/16/2016 1607   RBC 4.59 08/16/2016 1607   HGB 15.0 08/16/2016 1607   HGB 15.5 01/14/2013 1416   HCT 44.1 08/16/2016 1607   HCT 48.2 01/14/2013 1416   PLT 205.0 08/16/2016 1607   PLT 205 01/14/2013 1416   MCV 96.0 08/16/2016 1607   MCV 85 01/14/2013 1416   MCH 29.7 01/11/2014 1540   MCHC 33.9 08/16/2016 1607   RDW 13.7 08/16/2016 1607   RDW 14.0 01/14/2013 1416   LYMPHSABS 2.6 01/11/2014 1540   MONOABS 0.9 01/11/2014 1540   EOSABS 0.2 01/11/2014 1540   BASOSABS 0.1 01/11/2014 1540    Hgb A1C Lab Results  Component Value Date   HGBA1C 5.6 09/30/2017           Assessment & Plan:

## 2017-10-02 ENCOUNTER — Encounter: Payer: Self-pay | Admitting: Internal Medicine

## 2017-10-02 MED ORDER — GLIPIZIDE 5 MG PO TABS: 5.0000 mg | ORAL_TABLET | Freq: Every day | ORAL | 2 refills | Status: DC

## 2017-10-02 NOTE — Assessment & Plan Note (Signed)
POCT A1C 5.6% No microalbumin secondary to ACEI therapy Will decrease Glipizide to 5 mg daily Encouraged him to continue to work on low carb diet and exercise for weight loss Encouraged yearly eye exam Foot exam today He declines flu and pneumovax

## 2017-10-02 NOTE — Patient Instructions (Signed)
Diabetes Mellitus and Nutrition When you have diabetes (diabetes mellitus), it is very important to have healthy eating habits because your blood sugar (glucose) levels are greatly affected by what you eat and drink. Eating healthy foods in the appropriate amounts, at about the same times every day, can help you:  Control your blood glucose.  Lower your risk of heart disease.  Improve your blood pressure.  Reach or maintain a healthy weight.  Every person with diabetes is different, and each person has different needs for a meal plan. Your health care provider may recommend that you work with a diet and nutrition specialist (dietitian) to make a meal plan that is best for you. Your meal plan may vary depending on factors such as:  The calories you need.  The medicines you take.  Your weight.  Your blood glucose, blood pressure, and cholesterol levels.  Your activity level.  Other health conditions you have, such as heart or kidney disease.  How do carbohydrates affect me? Carbohydrates affect your blood glucose level more than any other type of food. Eating carbohydrates naturally increases the amount of glucose in your blood. Carbohydrate counting is a method for keeping track of how many carbohydrates you eat. Counting carbohydrates is important to keep your blood glucose at a healthy level, especially if you use insulin or take certain oral diabetes medicines. It is important to know how many carbohydrates you can safely have in each meal. This is different for every person. Your dietitian can help you calculate how many carbohydrates you should have at each meal and for snack. Foods that contain carbohydrates include:  Bread, cereal, rice, pasta, and crackers.  Potatoes and corn.  Peas, beans, and lentils.  Milk and yogurt.  Fruit and juice.  Desserts, such as cakes, cookies, ice cream, and candy.  How does alcohol affect me? Alcohol can cause a sudden decrease in blood  glucose (hypoglycemia), especially if you use insulin or take certain oral diabetes medicines. Hypoglycemia can be a life-threatening condition. Symptoms of hypoglycemia (sleepiness, dizziness, and confusion) are similar to symptoms of having too much alcohol. If your health care provider says that alcohol is safe for you, follow these guidelines:  Limit alcohol intake to no more than 1 drink per day for nonpregnant women and 2 drinks per day for men. One drink equals 12 oz of beer, 5 oz of wine, or 1 oz of hard liquor.  Do not drink on an empty stomach.  Keep yourself hydrated with water, diet soda, or unsweetened iced tea.  Keep in mind that regular soda, juice, and other mixers may contain a lot of sugar and must be counted as carbohydrates.  What are tips for following this plan? Reading food labels  Start by checking the serving size on the label. The amount of calories, carbohydrates, fats, and other nutrients listed on the label are based on one serving of the food. Many foods contain more than one serving per package.  Check the total grams (g) of carbohydrates in one serving. You can calculate the number of servings of carbohydrates in one serving by dividing the total carbohydrates by 15. For example, if a food has 30 g of total carbohydrates, it would be equal to 2 servings of carbohydrates.  Check the number of grams (g) of saturated and trans fats in one serving. Choose foods that have low or no amount of these fats.  Check the number of milligrams (mg) of sodium in one serving. Most people   should limit total sodium intake to less than 2,300 mg per day.  Always check the nutrition information of foods labeled as "low-fat" or "nonfat". These foods may be higher in added sugar or refined carbohydrates and should be avoided.  Talk to your dietitian to identify your daily goals for nutrients listed on the label. Shopping  Avoid buying canned, premade, or processed foods. These  foods tend to be high in fat, sodium, and added sugar.  Shop around the outside edge of the grocery store. This includes fresh fruits and vegetables, bulk grains, fresh meats, and fresh dairy. Cooking  Use low-heat cooking methods, such as baking, instead of high-heat cooking methods like deep frying.  Cook using healthy oils, such as olive, canola, or sunflower oil.  Avoid cooking with butter, cream, or high-fat meats. Meal planning  Eat meals and snacks regularly, preferably at the same times every day. Avoid going long periods of time without eating.  Eat foods high in fiber, such as fresh fruits, vegetables, beans, and whole grains. Talk to your dietitian about how many servings of carbohydrates you can eat at each meal.  Eat 4-6 ounces of lean protein each day, such as lean meat, chicken, fish, eggs, or tofu. 1 ounce is equal to 1 ounce of meat, chicken, or fish, 1 egg, or 1/4 cup of tofu.  Eat some foods each day that contain healthy fats, such as avocado, nuts, seeds, and fish. Lifestyle   Check your blood glucose regularly.  Exercise at least 30 minutes 5 or more days each week, or as told by your health care provider.  Take medicines as told by your health care provider.  Do not use any products that contain nicotine or tobacco, such as cigarettes and e-cigarettes. If you need help quitting, ask your health care provider.  Work with a counselor or diabetes educator to identify strategies to manage stress and any emotional and social challenges. What are some questions to ask my health care provider?  Do I need to meet with a diabetes educator?  Do I need to meet with a dietitian?  What number can I call if I have questions?  When are the best times to check my blood glucose? Where to find more information:  American Diabetes Association: diabetes.org/food-and-fitness/food  Academy of Nutrition and Dietetics:  www.eatright.org/resources/health/diseases-and-conditions/diabetes  National Institute of Diabetes and Digestive and Kidney Diseases (NIH): www.niddk.nih.gov/health-information/diabetes/overview/diet-eating-physical-activity Summary  A healthy meal plan will help you control your blood glucose and maintain a healthy lifestyle.  Working with a diet and nutrition specialist (dietitian) can help you make a meal plan that is best for you.  Keep in mind that carbohydrates and alcohol have immediate effects on your blood glucose levels. It is important to count carbohydrates and to use alcohol carefully. This information is not intended to replace advice given to you by your health care provider. Make sure you discuss any questions you have with your health care provider. Document Released: 10/08/2004 Document Revised: 02/16/2016 Document Reviewed: 02/16/2016 Elsevier Interactive Patient Education  2018 Elsevier Inc.  

## 2017-10-04 ENCOUNTER — Telehealth: Payer: Self-pay | Admitting: Internal Medicine

## 2017-10-04 NOTE — Telephone Encounter (Signed)
Patient calling to inform that [facility] still has not received the sleep study for the DOT clearance Please fax

## 2017-10-04 NOTE — Telephone Encounter (Signed)
Patient needs compliance cpap faxed to [facility]. [phone]

## 2017-10-04 NOTE — Telephone Encounter (Signed)
Faxed

## 2017-11-07 ENCOUNTER — Other Ambulatory Visit: Payer: Self-pay

## 2017-11-07 DIAGNOSIS — I10 Essential (primary) hypertension: Secondary | ICD-10-CM

## 2017-11-07 MED ORDER — LISINOPRIL 20 MG PO TABS: 20.0000 mg | ORAL_TABLET | Freq: Every day | ORAL | 0 refills | Status: DC

## 2017-11-07 MED ORDER — AMLODIPINE BESYLATE 10 MG PO TABS: 10.0000 mg | ORAL_TABLET | Freq: Every day | ORAL | 0 refills | Status: DC

## 2017-12-21 ENCOUNTER — Other Ambulatory Visit: Payer: Self-pay | Admitting: Internal Medicine

## 2017-12-24 ENCOUNTER — Other Ambulatory Visit: Payer: Self-pay | Admitting: Internal Medicine

## 2018-01-01 ENCOUNTER — Other Ambulatory Visit: Payer: Self-pay | Admitting: Internal Medicine

## 2018-01-12 ENCOUNTER — Encounter: Payer: Self-pay | Admitting: Internal Medicine

## 2018-01-12 ENCOUNTER — Ambulatory Visit (INDEPENDENT_AMBULATORY_CARE_PROVIDER_SITE_OTHER): Payer: BLUE CROSS/BLUE SHIELD | Admitting: Internal Medicine

## 2018-01-12 VITALS — BP 126/80 | HR 76 | Temp 98.1°F | Ht 68.0 in | Wt 276.0 lb

## 2018-01-12 DIAGNOSIS — G4733 Obstructive sleep apnea (adult) (pediatric): Secondary | ICD-10-CM

## 2018-01-12 DIAGNOSIS — I10 Essential (primary) hypertension: Secondary | ICD-10-CM

## 2018-01-12 DIAGNOSIS — E785 Hyperlipidemia, unspecified: Secondary | ICD-10-CM | POA: Insufficient documentation

## 2018-01-12 DIAGNOSIS — F32A Depression, unspecified: Secondary | ICD-10-CM

## 2018-01-12 DIAGNOSIS — E7841 Elevated Lipoprotein(a): Secondary | ICD-10-CM

## 2018-01-12 DIAGNOSIS — F329 Major depressive disorder, single episode, unspecified: Secondary | ICD-10-CM

## 2018-01-12 DIAGNOSIS — Z0000 Encounter for general adult medical examination without abnormal findings: Secondary | ICD-10-CM

## 2018-01-12 DIAGNOSIS — E119 Type 2 diabetes mellitus without complications: Secondary | ICD-10-CM

## 2018-01-12 DIAGNOSIS — E1169 Type 2 diabetes mellitus with other specified complication: Secondary | ICD-10-CM | POA: Insufficient documentation

## 2018-01-12 DIAGNOSIS — H6531 Chronic mucoid otitis media, right ear: Secondary | ICD-10-CM

## 2018-01-12 DIAGNOSIS — F419 Anxiety disorder, unspecified: Secondary | ICD-10-CM

## 2018-01-12 MED ORDER — CLARITHROMYCIN 500 MG PO TABS: 500.0000 mg | ORAL_TABLET | Freq: Two times a day (BID) | ORAL | 0 refills | Status: DC

## 2018-01-12 NOTE — Patient Instructions (Signed)

## 2018-01-12 NOTE — Assessment & Plan Note (Signed)
He does not want to wean Fluoxetine at this time Support offered today

## 2018-01-12 NOTE — Progress Notes (Signed)
Subjective:    Patient ID: Anthony BarriosJeremy Myers, male    DOB: 08/31/1980, 37 y.o.   MRN: [id number]  HPI  Pt presents to the clinic today for his annual exam. He is also due to follow up chronic conditions.  HTN: His BP today is 126/80. He is taking Lisinopril and Amlodipine as prescribed. ECG from 02/2015 reviewed.  DM 2: His last A1C was 5.6%, 09/2017. He is taking Glipizide as prescribed. His sugars range 40-126. He checks his feet daily. He is on Lisinopril for renal protection. His last eye exam was more than 2 years ago.  OSA: He averages 6 hours of sleep per night with use of his CPAP. He feels rested when he wakes up. Sleep study from 06/2017 reviewed.   HLD: Mainly high triglycerides. His last LDL was 66, triglycerides 85, 10/2016. He is taking Fenofibrate as prescribed. He tries to consume a low fat diet.  Anxiety and Depression: Situational. He is recently divorced and now having to deal with child custody hearings. He reports good control on Fluoxetine. He denies SI/HI.  Flu: 02/2015 Tetanus: 09/2014 Pneumovax: never Dentist: biannually  Diet: He does eat meat. He consumes fruits and veggies daily. He has been trying to avoid fried foods. He drinks mostly water. Exercise: None  Review of Systems      Past Medical History:  Diagnosis Date  . Anxiety   . Depression   . Insomnia     Current Outpatient Medications  Medication Sig Dispense Refill  . amLODipine (NORVASC) 10 MG tablet Take 1 tablet (10 mg total) by mouth daily. 90 tablet 0  . fenofibrate (TRICOR) 48 MG tablet TAKE 1 TABLET (48 MG TOTAL) BY MOUTH DAILY. 90 tablet 0  . FLUoxetine (PROZAC) 20 MG capsule TAKE 1 CAPSULE (20 MG TOTAL) BY MOUTH DAILY. 90 capsule 0  . glipiZIDE (GLUCOTROL) 5 MG tablet TAKE 1 TABLET (5 MG TOTAL) BY MOUTH DAILY BEFORE BREAKFAST. 30 tablet 0  . lisinopril (PRINIVIL,ZESTRIL) 20 MG tablet Take 1 tablet (20 mg total) by mouth daily. 90 tablet 0  . meloxicam (MOBIC) 15 MG tablet Take 1 tablet  (15 mg total) by mouth daily. 30 tablet 2   No current facility-administered medications for this visit.     Allergies  Allergen Reactions  . Tylenol [Acetaminophen] Swelling    "feels bad"    Family History  Problem Relation Age of Onset  . Mental illness Mother   . Hypertension Father     Social History   Socioeconomic History  . Marital status: Married    Spouse name: Not on file  . Number of children: Not on file  . Years of education: Not on file  . Highest education level: Not on file  Occupational History  . Not on file  Social Needs  . Financial resource strain: Not on file  . Food insecurity:    Worry: Not on file    Inability: Not on file  . Transportation needs:    Medical: Not on file    Non-medical: Not on file  Tobacco Use  . Smoking status: Former Games developermoker  . Smokeless tobacco: Never Used  Substance and Sexual Activity  . Alcohol use: No    Alcohol/week: 0.0 standard drinks  . Drug use: No  . Sexual activity: Not on file  Lifestyle  . Physical activity:    Days per week: Not on file    Minutes per session: Not on file  . Stress: Not on file  Relationships  . Social connections:    Talks on phone: Not on file    Gets together: Not on file    Attends religious service: Not on file    Active member of club or organization: Not on file    Attends meetings of clubs or organizations: Not on file    Relationship status: Not on file  . Intimate partner violence:    Fear of current or ex partner: Not on file    Emotionally abused: Not on file    Physically abused: Not on file    Forced sexual activity: Not on file  Other Topics Concern  . Not on file  Social History Narrative  . Not on file     Constitutional: Denies fever, malaise, fatigue, headache or abrupt weight changes.  HEENT: Denies eye pain, eye redness, ear pain, ringing in the ears, wax buildup, runny nose, nasal congestion, bloody nose, or sore throat. Respiratory: Denies  difficulty breathing, shortness of breath, cough or sputum production.   Cardiovascular: Denies chest pain, chest tightness, palpitations or swelling in the hands or feet.  Gastrointestinal: Denies abdominal pain, bloating, constipation, diarrhea or blood in the stool.  GU: Denies urgency, frequency, pain with urination, burning sensation, blood in urine, odor or discharge. Musculoskeletal: Denies decrease in range of motion, difficulty with gait, muscle pain or joint pain and swelling.  Skin: Denies redness, rashes, lesions or ulcercations.  Neurological: Denies dizziness, difficulty with memory, difficulty with speech or problems with balance and coordination.  Psych: Denies anxiety, depression, SI/HI.  No other specific complaints in a complete review of systems (except as listed in HPI above).  Objective:   Physical Exam BP 126/80   Pulse 76   Temp 98.1 F (36.7 C) (Oral)   Ht 5\' 8"  (1.727 m)   Wt 276 lb (125.2 kg)   SpO2 97%   BMI 41.97 kg/m  Wt Readings from Last 3 Encounters:  01/12/18 276 lb (125.2 kg)  09/30/17 289 lb (131.1 kg)  09/12/17 293 lb (132.9 kg)    General: Appears her stated age, obese in NAD. Skin: Warm, dry and intact.  HEENT: Head: normal shape and size; Eyes: sclera white, no icterus, conjunctiva pink, PERRLA and EOMs intact; Left Ear: Tm's gray and intact, normal light reflex; Right Ear: TM filled with pus; Throat/Mouth: Teeth present, mucosa pink and moist, no exudate, lesions or ulcerations noted.  Neck:  Neck supple, trachea midline. No masses, lumps or thyromegaly present.  Cardiovascular: Normal rate and rhythm. S1,S2 noted.  No murmur, rubs or gallops noted. No JVD or BLE edema.  Pulmonary/Chest: Normal effort and positive vesicular breath sounds. No respiratory distress. No wheezes, rales or ronchi noted.  Abdomen: Soft and nontender. Normal bowel sounds. No distention or masses noted. Liver, spleen and kidneys non palpable. Musculoskeletal:  Strength 5/5 BUE/BLE. No difficulty with gait.  Neurological: Alert and oriented. Cranial nerves II-XII grossly intact. Coordination normal.  Psychiatric: Mood and affect normal. Behavior is normal. Judgment and thought content normal.    BMET    Component Value Date/Time   NA 136 08/16/2016 1607   NA 142 01/14/2013 1416   K 3.9 08/16/2016 1607   K 4.1 01/14/2013 1416   CL 101 08/16/2016 1607   CL 109 (H) 01/14/2013 1416   CO2 26 08/16/2016 1607   CO2 27 01/14/2013 1416   GLUCOSE 102 (H) 08/16/2016 1607   GLUCOSE 113 (H) 01/14/2013 1416   BUN 13 08/16/2016 1607   BUN 11  01/14/2013 1416   CREATININE 0.85 08/16/2016 1607   CREATININE 0.76 03/09/2013 1601   CALCIUM 9.4 08/16/2016 1607   CALCIUM 9.3 01/14/2013 1416   GFRNONAA >60 01/14/2013 1416   GFRAA >60 01/14/2013 1416    Lipid Panel     Component Value Date/Time   CHOL 113 11/22/2016 0858   TRIG 85.0 11/22/2016 0858   HDL 30.00 (L) 11/22/2016 0858   CHOLHDL 4 11/22/2016 0858   VLDL 17.0 11/22/2016 0858   LDLCALC 66 11/22/2016 0858    CBC    Component Value Date/Time   WBC 8.9 08/16/2016 1607   RBC 4.59 08/16/2016 1607   HGB 15.0 08/16/2016 1607   HGB 15.5 01/14/2013 1416   HCT 44.1 08/16/2016 1607   HCT 48.2 01/14/2013 1416   PLT 205.0 08/16/2016 1607   PLT 205 01/14/2013 1416   MCV 96.0 08/16/2016 1607   MCV 85 01/14/2013 1416   MCH 29.7 01/11/2014 1540   MCHC 33.9 08/16/2016 1607   RDW 13.7 08/16/2016 1607   RDW 14.0 01/14/2013 1416   LYMPHSABS 2.6 01/11/2014 1540   MONOABS 0.9 01/11/2014 1540   EOSABS 0.2 01/11/2014 1540   BASOSABS 0.1 01/11/2014 1540    Hgb A1C Lab Results  Component Value Date   HGBA1C 5.6 09/30/2017             Assessment & Plan:   Preventative Health Maintenance:  He declines flu or pneumovax Tetanus UTD Encouraged him to consume a balanced diet and exercise regimen Advised him to see a dentist annually Will check CBC, CMET, Lipid and A1C today  Chronic  Right Otitis Media:  RX for Biaxin BID x 10 days He declines referral to ENT at this time  RTC in 6 months for followup DM 2 Nicki Reaperegina , NP

## 2018-01-12 NOTE — Assessment & Plan Note (Signed)
CMET and Lipid profile today Encouraged him to consume a low fat diet Continue Fenofibrate for now 

## 2018-01-12 NOTE — Assessment & Plan Note (Signed)
A1C today No microalbumin due to ACEI inhibitor Encouraged him to consume a low fat diet and exercise for weight loss May need to cut back Glipizide Foot exam today Encouraged yearly eye exam He declines flu and pneumovax

## 2018-01-12 NOTE — Assessment & Plan Note (Signed)
CBC and CMET today Continue Amlodipine and Lisinopril Reinforced DASH diet and exercise for weight loss

## 2018-01-12 NOTE — Assessment & Plan Note (Signed)
Encouraged continue weight loss Continue to wear CPAP

## 2018-01-13 LAB — CBC
HCT: 46.2 % (ref 39.0–52.0)
Hemoglobin: 15.5 g/dL (ref 13.0–17.0)
MCHC: 33.5 g/dL (ref 30.0–36.0)
MCV: 84.9 fl (ref 78.0–100.0)
Platelets: 283 10*3/uL (ref 150.0–400.0)
RBC: 5.44 Mil/uL (ref 4.22–5.81)
RDW: 13.7 % (ref 11.5–15.5)
WBC: 11.1 10*3/uL — ABNORMAL HIGH (ref 4.0–10.5)

## 2018-01-13 LAB — LIPID PANEL
CHOL/HDL RATIO: 3
Cholesterol: 101 mg/dL (ref 0–200)
HDL: 34.7 mg/dL — ABNORMAL LOW (ref >39.00)
LDL CALC: 44 mg/dL (ref 0–99)
NonHDL: 66.26
Triglycerides: 110 mg/dL (ref 0.0–149.0)
VLDL: 22 mg/dL (ref 0.0–40.0)

## 2018-01-13 LAB — HEMOGLOBIN A1C: HEMOGLOBIN A1C: 5.8 % (ref 4.6–6.5)

## 2018-01-13 LAB — COMPREHENSIVE METABOLIC PANEL
ALBUMIN: 4.6 g/dL (ref 3.5–5.2)
ALT: 30 U/L (ref 0–53)
AST: 22 U/L (ref 0–37)
Alkaline Phosphatase: 84 U/L (ref 39–117)
BUN: 11 mg/dL (ref 6–23)
CHLORIDE: 104 meq/L (ref 96–112)
CO2: 26 meq/L (ref 19–32)
CREATININE: 0.72 mg/dL (ref 0.40–1.50)
Calcium: 9.6 mg/dL (ref 8.4–10.5)
GFR: 130.24 mL/min (ref >60.00)
GLUCOSE: 86 mg/dL (ref 70–99)
Potassium: 4.4 mEq/L (ref 3.5–5.1)
SODIUM: 138 meq/L (ref 135–145)
Total Bilirubin: 0.3 mg/dL (ref 0.2–1.2)
Total Protein: 7.4 g/dL (ref 6.0–8.3)

## 2018-01-23 ENCOUNTER — Other Ambulatory Visit: Payer: Self-pay | Admitting: Internal Medicine

## 2018-01-23 MED ORDER — PREDNISONE 10 MG PO TABS: ORAL_TABLET | ORAL | 0 refills | Status: DC

## 2018-01-23 MED ORDER — AMOXICILLIN 875 MG PO TABS: 875.0000 mg | ORAL_TABLET | Freq: Two times a day (BID) | ORAL | 0 refills | Status: DC

## 2018-02-02 ENCOUNTER — Other Ambulatory Visit: Payer: Self-pay | Admitting: Internal Medicine

## 2018-02-03 ENCOUNTER — Other Ambulatory Visit: Payer: Self-pay | Admitting: Internal Medicine

## 2018-03-16 ENCOUNTER — Other Ambulatory Visit: Payer: Self-pay | Admitting: Internal Medicine

## 2018-04-30 ENCOUNTER — Other Ambulatory Visit: Payer: Self-pay | Admitting: Internal Medicine

## 2018-06-12 ENCOUNTER — Telehealth: Payer: Self-pay | Admitting: Internal Medicine

## 2018-06-12 NOTE — Telephone Encounter (Signed)
This msg was received by after hrs Friday. Does he need NV only or follow up with virtual OV? Copied from CRM [id number]. Topic: Appointment Scheduling - Scheduling Inquiry for Clinic >> Jun 09, 2018  4:22 PM Gwenlyn Fudge A wrote: Reason for CRM: Pt called stating he has stopped taking his medications in order to see if he can handle it. Pt is requesting to come in for lab work and blood pressure check to make sure his levels are good. Please advise.

## 2018-06-12 NOTE — Telephone Encounter (Signed)
Needs virtual office visit with nurse visit for BP check and lab visit for labs

## 2018-06-12 NOTE — Telephone Encounter (Signed)
Pt is set up for labs tomorrow 5/19, needs orders, and follow up appt Wed 5/20

## 2018-06-13 ENCOUNTER — Other Ambulatory Visit: Payer: Self-pay | Admitting: Internal Medicine

## 2018-06-13 ENCOUNTER — Other Ambulatory Visit (INDEPENDENT_AMBULATORY_CARE_PROVIDER_SITE_OTHER): Payer: BLUE CROSS/BLUE SHIELD

## 2018-06-13 ENCOUNTER — Other Ambulatory Visit: Payer: Self-pay

## 2018-06-13 ENCOUNTER — Ambulatory Visit: Payer: BLUE CROSS/BLUE SHIELD

## 2018-06-13 VITALS — BP 124/80 | HR 72

## 2018-06-13 DIAGNOSIS — E119 Type 2 diabetes mellitus without complications: Secondary | ICD-10-CM

## 2018-06-13 DIAGNOSIS — E7841 Elevated Lipoprotein(a): Secondary | ICD-10-CM

## 2018-06-13 DIAGNOSIS — I10 Essential (primary) hypertension: Secondary | ICD-10-CM

## 2018-06-13 LAB — COMPREHENSIVE METABOLIC PANEL
ALT: 31 U/L (ref 0–53)
AST: 22 U/L (ref 0–37)
Albumin: 4.4 g/dL (ref 3.5–5.2)
Alkaline Phosphatase: 67 U/L (ref 39–117)
BUN: 11 mg/dL (ref 6–23)
CO2: 29 mEq/L (ref 19–32)
Calcium: 9.3 mg/dL (ref 8.4–10.5)
Chloride: 103 mEq/L (ref 96–112)
Creatinine, Ser: 0.76 mg/dL (ref 0.40–1.50)
GFR: 114.87 mL/min (ref >60.00)
Glucose, Bld: 104 mg/dL — ABNORMAL HIGH (ref 70–99)
Potassium: 4 mEq/L (ref 3.5–5.1)
Sodium: 138 mEq/L (ref 135–145)
Total Bilirubin: 0.4 mg/dL (ref 0.2–1.2)
Total Protein: 7.2 g/dL (ref 6.0–8.3)

## 2018-06-13 LAB — LIPID PANEL
Cholesterol: 119 mg/dL (ref 0–200)
HDL: 34 mg/dL — ABNORMAL LOW (ref >39.00)
LDL Cholesterol: 70 mg/dL (ref 0–99)
NonHDL: 84.52
Total CHOL/HDL Ratio: 3
Triglycerides: 72 mg/dL (ref 0.0–149.0)
VLDL: 14.4 mg/dL (ref 0.0–40.0)

## 2018-06-13 LAB — HEMOGLOBIN A1C: Hgb A1c MFr Bld: 5.9 % (ref 4.6–6.5)

## 2018-06-13 NOTE — Progress Notes (Signed)
Pt was seen today blood pressure check. Pt denies CP/tightness, SOB or palpitations and he states he has not been taking his BP medication   BP 124/80 HR 72 Per orders of [facility], NP-C, blood pressure check by Roena Malady.

## 2018-06-14 ENCOUNTER — Encounter: Payer: Self-pay | Admitting: Internal Medicine

## 2018-06-14 ENCOUNTER — Ambulatory Visit (INDEPENDENT_AMBULATORY_CARE_PROVIDER_SITE_OTHER): Payer: BLUE CROSS/BLUE SHIELD | Admitting: Internal Medicine

## 2018-06-14 DIAGNOSIS — F419 Anxiety disorder, unspecified: Secondary | ICD-10-CM

## 2018-06-14 DIAGNOSIS — F329 Major depressive disorder, single episode, unspecified: Secondary | ICD-10-CM

## 2018-06-14 DIAGNOSIS — G4733 Obstructive sleep apnea (adult) (pediatric): Secondary | ICD-10-CM

## 2018-06-14 DIAGNOSIS — I10 Essential (primary) hypertension: Secondary | ICD-10-CM

## 2018-06-14 DIAGNOSIS — E119 Type 2 diabetes mellitus without complications: Secondary | ICD-10-CM | POA: Diagnosis not present

## 2018-06-14 DIAGNOSIS — E7841 Elevated Lipoprotein(a): Secondary | ICD-10-CM

## 2018-06-14 NOTE — Assessment & Plan Note (Signed)
Controlled, now off meds Will repeat A1C and microalbumin in 3 months, lab only Encouraged him to consume a low carb diet and exercise for weight loss Encouraged routine foot and eye exams Flu shot UTD He declines pneumonia shot

## 2018-06-14 NOTE — Assessment & Plan Note (Signed)
Discussed how weight loss could help improve OSA Continue CPAP for now 

## 2018-06-14 NOTE — Assessment & Plan Note (Signed)
Controlled off meds Reinforced DASH diet and exercise for weight loss Lab only CMET reviewed

## 2018-06-14 NOTE — Patient Instructions (Signed)
Fat and Cholesterol Restricted Eating Plan Getting too much fat and cholesterol in your diet may cause health problems. Choosing the right foods helps keep your fat and cholesterol at normal levels. This can keep you from getting certain diseases. Your doctor may recommend an eating plan that includes:  Total fat: ______% or less of total calories a day.  Saturated fat: ______% or less of total calories a day.  Cholesterol: less than _________mg a day.  Fiber: ______g a day. What are tips for following this plan? Meal planning  At meals, divide your plate into four equal parts: ? Fill one-half of your plate with vegetables and green salads. ? Fill one-fourth of your plate with whole grains. ? Fill one-fourth of your plate with low-fat (lean) protein foods.  Eat fish that is high in omega-3 fats at least two times a week. This includes mackerel, tuna, sardines, and salmon.  Eat foods that are high in fiber, such as whole grains, beans, apples, broccoli, carrots, peas, and barley. General tips   Work with your doctor to lose weight if you need to.  Avoid: ? Foods with added sugar. ? Fried foods. ? Foods with partially hydrogenated oils.  Limit alcohol intake to no more than 1 drink a day for nonpregnant women and 2 drinks a day for men. One drink equals 12 oz of beer, 5 oz of wine, or 1 oz of hard liquor. Reading food labels  Check food labels for: ? Trans fats. ? Partially hydrogenated oils. ? Saturated fat (g) in each serving. ? Cholesterol (mg) in each serving. ? Fiber (g) in each serving.  Choose foods with healthy fats, such as: ? Monounsaturated fats. ? Polyunsaturated fats. ? Omega-3 fats.  Choose grain products that have whole grains. Look for the word "whole" as the first word in the ingredient list. Cooking  Cook foods using low-fat methods. These include baking, boiling, grilling, and broiling.  Eat more home-cooked foods. Eat at restaurants and buffets  less often.  Avoid cooking using saturated fats, such as butter, cream, palm oil, palm kernel oil, and coconut oil. Recommended foods  Fruits  All fresh, canned (in natural juice), or frozen fruits. Vegetables  Fresh or frozen vegetables (raw, steamed, roasted, or grilled). Green salads. Grains  Whole grains, such as whole wheat or whole grain breads, crackers, cereals, and pasta. Unsweetened oatmeal, bulgur, barley, quinoa, or brown rice. Corn or whole wheat flour tortillas. Meats and other protein foods  Ground beef (85% or leaner), grass-fed beef, or beef trimmed of fat. Skinless chicken or turkey. Ground chicken or turkey. Pork trimmed of fat. All fish and seafood. Egg whites. Dried beans, peas, or lentils. Unsalted nuts or seeds. Unsalted canned beans. Nut butters without added sugar or oil. Dairy  Low-fat or nonfat dairy products, such as skim or 1% milk, 2% or reduced-fat cheeses, low-fat and fat-free ricotta or cottage cheese, or plain low-fat and nonfat yogurt. Fats and oils  Tub margarine without trans fats. Light or reduced-fat mayonnaise and salad dressings. Avocado. Olive, canola, sesame, or safflower oils. The items listed above may not be a complete list of foods and beverages you can eat. Contact a dietitian for more information. Foods to avoid Fruits  Canned fruit in heavy syrup. Fruit in cream or butter sauce. Fried fruit. Vegetables  Vegetables cooked in cheese, cream, or butter sauce. Fried vegetables. Grains  White bread. White pasta. White rice. Cornbread. Bagels, pastries, and croissants. Crackers and snack foods that contain trans fat   and hydrogenated oils. Meats and other protein foods  Fatty cuts of meat. Ribs, chicken wings, bacon, sausage, bologna, salami, chitterlings, fatback, hot dogs, bratwurst, and packaged lunch meats. Liver and organ meats. Whole eggs and egg yolks. Chicken and turkey with skin. Fried meat. Dairy  Whole or 2% milk, cream,  half-and-half, and cream cheese. Whole milk cheeses. Whole-fat or sweetened yogurt. Full-fat cheeses. Nondairy creamers and whipped toppings. Processed cheese, cheese spreads, and cheese curds. Beverages  Alcohol. Sugar-sweetened drinks such as sodas, lemonade, and fruit drinks. Fats and oils  Butter, stick margarine, lard, shortening, ghee, or bacon fat. Coconut, palm kernel, and palm oils. Sweets and desserts  Corn syrup, sugars, honey, and molasses. Candy. Jam and jelly. Syrup. Sweetened cereals. Cookies, pies, cakes, donuts, muffins, and ice cream. The items listed above may not be a complete list of foods and beverages you should avoid. Contact a dietitian for more information. Summary  Choosing the right foods helps keep your fat and cholesterol at normal levels. This can keep you from getting certain diseases.  At meals, fill one-half of your plate with vegetables and green salads.  Eat high-fiber foods, like whole grains, beans, apples, carrots, peas, and barley.  Limit added sugar, saturated fats, alcohol, and fried foods. This information is not intended to replace advice given to you by your health care provider. Make sure you discuss any questions you have with your health care provider. Document Released: 07/13/2011 Document Revised: 09/14/2017 Document Reviewed: 09/28/2016 Elsevier Interactive Patient Education  2019 [employer] Inc.   

## 2018-06-14 NOTE — Progress Notes (Signed)
Virtual Visit via Video Note  I connected with Anthony Myers on 06/14/18 at  2:00 PM EDT by a video enabled telemedicine application and verified that I am speaking with the correct person using two identifiers.  Location: Patient: Home Provider: Office   I discussed the limitations of evaluation and management by telemedicine and the availability of in person appointments. The patient expressed understanding and agreed to proceed.  History of Present Illness:  Pt due for follow up of chronic conditions.  HTN: He has not taken his Amlodipine or Lisinopril x 3 weeks. He is monitoring his BP at home. It runs about 120/80. He denies headaches, dizziness or visual changes.  ECG from 02/2015 reviewed.  Anxiety and Depression: Caused by stress of his divorce. He is currently going through a custody battle. He is taking Fluoxetine as prescribed. He is not seeing a therapist.  DM 2: His last A1C was 5.8%, 02/2018. He has not taken Glipizide in 3 weeks. He is not monitoring his sugars. He checks his feet routinely. He gets an eye exam yearly. Flu shot UTD. Pneumonia never.  HLD: His last LDL was < 100. He stopped taking Fenofibrate 3 weeks ago. He has been consuming a low fat diet.  OSA: He is sleeping well with the use of his CPAP. Sleep study from 06/2017 reviewed.   Past Medical History:  Diagnosis Date  . Anxiety   . Depression   . Insomnia     Current Outpatient Medications  Medication Sig Dispense Refill  . FLUoxetine (PROZAC) 20 MG capsule TAKE 1 CAPSULE (20 MG TOTAL) BY MOUTH DAILY. 90 capsule 2  . meloxicam (MOBIC) 15 MG tablet Take 1 tablet (15 mg total) by mouth daily. 30 tablet 2  . amLODipine (NORVASC) 10 MG tablet TAKE 1 TABLET BY MOUTH EVERY DAY (Patient not taking: Reported on 06/14/2018) 90 tablet 1  . fenofibrate (TRICOR) 48 MG tablet TAKE 1 TABLET (48 MG TOTAL) BY MOUTH DAILY. (Patient not taking: Reported on 06/14/2018) 90 tablet 0  . glipiZIDE (GLUCOTROL) 5 MG tablet TAKE  1 TABLET (5 MG TOTAL) BY MOUTH DAILY BEFORE BREAKFAST. (Patient not taking: Reported on 06/14/2018) 90 tablet 0  . lisinopril (PRINIVIL,ZESTRIL) 20 MG tablet Take 1 tablet (20 mg total) by mouth daily. (Patient not taking: Reported on 06/14/2018) 90 tablet 0   No current facility-administered medications for this visit.     Allergies  Allergen Reactions  . Tylenol [Acetaminophen] Swelling    "feels bad"    Family History  Problem Relation Age of Onset  . Mental illness Mother   . Hypertension Father     Social History   Socioeconomic History  . Marital status: Married    Spouse name: Not on file  . Number of children: Not on file  . Years of education: Not on file  . Highest education level: Not on file  Occupational History  . Not on file  Social Needs  . Financial resource strain: Not on file  . Food insecurity:    Worry: Not on file    Inability: Not on file  . Transportation needs:    Medical: Not on file    Non-medical: Not on file  Tobacco Use  . Smoking status: Former Games developer  . Smokeless tobacco: Never Used  Substance and Sexual Activity  . Alcohol use: No    Alcohol/week: 0.0 standard drinks  . Drug use: No  . Sexual activity: Not on file  Lifestyle  . Physical activity:  Days per week: Not on file    Minutes per session: Not on file  . Stress: Not on file  Relationships  . Social connections:    Talks on phone: Not on file    Gets together: Not on file    Attends religious service: Not on file    Active member of club or organization: Not on file    Attends meetings of clubs or organizations: Not on file    Relationship status: Not on file  . Intimate partner violence:    Fear of current or ex partner: Not on file    Emotionally abused: Not on file    Physically abused: Not on file    Forced sexual activity: Not on file  Other Topics Concern  . Not on file  Social History Narrative  . Not on file     Constitutional: Denies fever, malaise,  fatigue, headache or abrupt weight changes.  HEENT: Denies eye pain, eye redness, ear pain, ringing in the ears, wax buildup, runny nose, nasal congestion, bloody nose, or sore throat. Respiratory: Denies difficulty breathing, shortness of breath, cough or sputum production.   Cardiovascular: Denies chest pain, chest tightness, palpitations or swelling in the hands or feet.  Gastrointestinal: Denies abdominal pain, bloating, constipation, diarrhea or blood in the stool.  GU: Denies urgency, frequency, pain with urination, burning sensation, blood in urine, odor or discharge. Musculoskeletal: Denies decrease in range of motion, difficulty with gait, muscle pain or joint pain and swelling.  Skin: Denies redness, rashes, lesions or ulcercations.  Neurological: Denies dizziness, difficulty with memory, difficulty with speech or problems with balance and coordination.  Psych: Pt has a history of anxiety and depression. Denies SI/HI.  No other specific complaints in a complete review of systems (except as listed in HPI above).   BP 124/80   Pulse 72   Wt 275 lb (124.7 kg)   BMI 41.81 kg/m  Wt Readings from Last 3 Encounters:  06/14/18 275 lb (124.7 kg)  01/12/18 276 lb (125.2 kg)  09/30/17 289 lb (131.1 kg)    General: Appears his stated age, obese in NAD. Skin: Warm, dry and intact. No ulcerations noted. Pulmonary/Chest: Normal effort. No respiratory distress.  Neurological: Alert and oriented. Psychiatric: Mood and affect normal. Behavior is normal. Judgment and thought content normal.     BMET    Component Value Date/Time   NA 138 06/13/2018 0941   NA 142 01/14/2013 1416   K 4.0 06/13/2018 0941   K 4.1 01/14/2013 1416   CL 103 06/13/2018 0941   CL 109 (H) 01/14/2013 1416   CO2 29 06/13/2018 0941   CO2 27 01/14/2013 1416   GLUCOSE 104 (H) 06/13/2018 0941   GLUCOSE 113 (H) 01/14/2013 1416   BUN 11 06/13/2018 0941   BUN 11 01/14/2013 1416   CREATININE 0.76 06/13/2018 0941    CREATININE 0.76 03/09/2013 1601   CALCIUM 9.3 06/13/2018 0941   CALCIUM 9.3 01/14/2013 1416   GFRNONAA >60 01/14/2013 1416   GFRAA >60 01/14/2013 1416    Lipid Panel     Component Value Date/Time   CHOL 119 06/13/2018 0941   TRIG 72.0 06/13/2018 0941   HDL 34.00 (L) 06/13/2018 0941   CHOLHDL 3 06/13/2018 0941   VLDL 14.4 06/13/2018 0941   LDLCALC 70 06/13/2018 0941    CBC    Component Value Date/Time   WBC 11.1 (H) 01/12/2018 1633   RBC 5.44 01/12/2018 1633   HGB 15.5 01/12/2018 1633  HGB 15.5 01/14/2013 1416   HCT 46.2 01/12/2018 1633   HCT 48.2 01/14/2013 1416   PLT 283.0 01/12/2018 1633   PLT 205 01/14/2013 1416   MCV 84.9 01/12/2018 1633   MCV 85 01/14/2013 1416   MCH 29.7 01/11/2014 1540   MCHC 33.5 01/12/2018 1633   RDW 13.7 01/12/2018 1633   RDW 14.0 01/14/2013 1416   LYMPHSABS 2.6 01/11/2014 1540   MONOABS 0.9 01/11/2014 1540   EOSABS 0.2 01/11/2014 1540   BASOSABS 0.1 01/11/2014 1540    Hgb A1C Lab Results  Component Value Date   HGBA1C 5.9 06/13/2018       Assessment and Plan:  See problem based charting.  Follow Up Instructions:    I discussed the assessment and treatment plan with the patient. The patient was provided an opportunity to ask questions and all were answered. The patient agreed with the plan and demonstrated an understanding of the instructions.   The patient was advised to call back or seek an in-person evaluation if the symptoms worsen or if the condition fails to improve as anticipated.     Nicki Reaper, NP

## 2018-06-14 NOTE — Assessment & Plan Note (Signed)
Controlled off Fenofibrate Will repeat lipid in 3 months, lab only Encouraged him to consume a low fat diet.

## 2018-06-14 NOTE — Assessment & Plan Note (Signed)
Continue Prozac Support offered today Will monitor

## 2018-06-20 ENCOUNTER — Ambulatory Visit: Payer: BLUE CROSS/BLUE SHIELD

## 2018-09-14 ENCOUNTER — Encounter: Payer: Self-pay | Admitting: Internal Medicine

## 2018-09-15 ENCOUNTER — Ambulatory Visit (INDEPENDENT_AMBULATORY_CARE_PROVIDER_SITE_OTHER): Payer: BC Managed Care – PPO | Admitting: Internal Medicine

## 2018-09-15 ENCOUNTER — Encounter: Payer: Self-pay | Admitting: Internal Medicine

## 2018-09-15 ENCOUNTER — Other Ambulatory Visit: Payer: Self-pay

## 2018-09-15 VITALS — BP 124/78 | HR 76 | Temp 98.3°F | Wt 292.0 lb

## 2018-09-15 DIAGNOSIS — F32A Depression, unspecified: Secondary | ICD-10-CM

## 2018-09-15 DIAGNOSIS — F419 Anxiety disorder, unspecified: Secondary | ICD-10-CM

## 2018-09-15 DIAGNOSIS — E119 Type 2 diabetes mellitus without complications: Secondary | ICD-10-CM | POA: Diagnosis not present

## 2018-09-15 DIAGNOSIS — I10 Essential (primary) hypertension: Secondary | ICD-10-CM | POA: Diagnosis not present

## 2018-09-15 DIAGNOSIS — G4733 Obstructive sleep apnea (adult) (pediatric): Secondary | ICD-10-CM | POA: Diagnosis not present

## 2018-09-15 DIAGNOSIS — E782 Mixed hyperlipidemia: Secondary | ICD-10-CM | POA: Diagnosis not present

## 2018-09-15 DIAGNOSIS — F329 Major depressive disorder, single episode, unspecified: Secondary | ICD-10-CM

## 2018-09-15 NOTE — Progress Notes (Signed)
Subjective:    Patient ID: Anthony BarriosJeremy Jeon, male    DOB: 07/17/1980, 38 y.o.   MRN: [id number]  HPI  Pt presents to the clinic today for follow up of chronic conditions.  HTN: His BP today is 124/78. He is now off all his antihypertensive medications. He denies chest pain or swelling in the lower extremities.  ECG from 02/2015 reviewed.  OSA: He is sleeping well with the use of his CPAP. Sleep study from 06/2017 reviewed.  Anxiety and Depression: Stable off his Fluoxetine. He has been feeling a little lightheaded since stopping medication. He is not currently seeing a therapist. He denies SI/HI.  DM 2: His last A1C was 5.9, 05/2018. He is not checking his sugars. He is no longer taking any oral diabetic medication. He has been consuming a low carb diet. He does not routinely check his feet. His last eye exam has been in the last year. He declines flu and pneumovax.  HLD: His last LDL was 70, 05/2018. He is no longer taking any cholesterol medication. He has been consuming a low fat diet.  Review of Systems      Past Medical History:  Diagnosis Date  . Anxiety   . Depression   . Insomnia     Current Outpatient Medications  Medication Sig Dispense Refill  . FLUoxetine (PROZAC) 20 MG capsule TAKE 1 CAPSULE (20 MG TOTAL) BY MOUTH DAILY. (Patient not taking: Reported on 09/15/2018) 90 capsule 2   No current facility-administered medications for this visit.     Allergies  Allergen Reactions  . Tylenol [Acetaminophen] Swelling    "feels bad"    Family History  Problem Relation Age of Onset  . Mental illness Mother   . Hypertension Father     Social History   Socioeconomic History  . Marital status: Married    Spouse name: Not on file  . Number of children: Not on file  . Years of education: Not on file  . Highest education level: Not on file  Occupational History  . Not on file  Social Needs  . Financial resource strain: Not on file  . Food insecurity    Worry: Not on  file    Inability: Not on file  . Transportation needs    Medical: Not on file    Non-medical: Not on file  Tobacco Use  . Smoking status: Former Games developermoker  . Smokeless tobacco: Never Used  Substance and Sexual Activity  . Alcohol use: No    Alcohol/week: 0.0 standard drinks  . Drug use: No  . Sexual activity: Not on file  Lifestyle  . Physical activity    Days per week: Not on file    Minutes per session: Not on file  . Stress: Not on file  Relationships  . Social Musicianconnections    Talks on phone: Not on file    Gets together: Not on file    Attends religious service: Not on file    Active member of club or organization: Not on file    Attends meetings of clubs or organizations: Not on file    Relationship status: Not on file  . Intimate partner violence    Fear of current or ex partner: Not on file    Emotionally abused: Not on file    Physically abused: Not on file    Forced sexual activity: Not on file  Other Topics Concern  . Not on file  Social History Narrative  . Not on  file     Constitutional: Denies fever, malaise, fatigue, headache or abrupt weight changes.  HEENT: Denies eye pain, eye redness, ear pain, ringing in the ears, wax buildup, runny nose, nasal congestion, bloody nose, or sore throat. Respiratory: Denies difficulty breathing, shortness of breath, cough or sputum production.   Cardiovascular: Denies chest pain, chest tightness, palpitations or swelling in the hands or feet.  Skin: Denies redness, rashes, lesions or ulcercations.  Neurological: Denies dizziness, difficulty with memory, difficulty with speech or problems with balance and coordination.  Psych: Denies anxiety, depression, SI/HI.  No other specific complaints in a complete review of systems (except as listed in HPI above).  Objective:   Physical Exam   BP 124/78   Pulse 76   Temp 98.3 F (36.8 C) (Temporal)   Wt 292 lb (132.5 kg)   SpO2 97%   BMI 44.40 kg/m  Wt Readings from Last  3 Encounters:  09/15/18 292 lb (132.5 kg)  06/14/18 275 lb (124.7 kg)  01/12/18 276 lb (125.2 kg)    General: Appears his stated age, obese, in NAD. Skin: Warm, dry and intact. No ulcerations noted. Cardiovascular: Normal rate and rhythm. S1,S2 noted.  No murmur, rubs or gallops noted. No JVD or BLE edema.  Pulmonary/Chest: Normal effort and positive vesicular breath sounds. No respiratory distress. No wheezes, rales or ronchi noted.  Neurological: Alert and oriented. Sensation intact to BLE. Psychiatric: Mood and affect normal. Behavior is normal. Judgment and thought content normal.    BMET    Component Value Date/Time   NA 138 06/13/2018 0941   NA 142 01/14/2013 1416   K 4.0 06/13/2018 0941   K 4.1 01/14/2013 1416   CL 103 06/13/2018 0941   CL 109 (H) 01/14/2013 1416   CO2 29 06/13/2018 0941   CO2 27 01/14/2013 1416   GLUCOSE 104 (H) 06/13/2018 0941   GLUCOSE 113 (H) 01/14/2013 1416   BUN 11 06/13/2018 0941   BUN 11 01/14/2013 1416   CREATININE 0.76 06/13/2018 0941   CREATININE 0.76 03/09/2013 1601   CALCIUM 9.3 06/13/2018 0941   CALCIUM 9.3 01/14/2013 1416   GFRNONAA >60 01/14/2013 1416   GFRAA >60 01/14/2013 1416    Lipid Panel     Component Value Date/Time   CHOL 119 06/13/2018 0941   TRIG 72.0 06/13/2018 0941   HDL 34.00 (L) 06/13/2018 0941   CHOLHDL 3 06/13/2018 0941   VLDL 14.4 06/13/2018 0941   LDLCALC 70 06/13/2018 0941    CBC    Component Value Date/Time   WBC 11.1 (H) 01/12/2018 1633   RBC 5.44 01/12/2018 1633   HGB 15.5 01/12/2018 1633   HGB 15.5 01/14/2013 1416   HCT 46.2 01/12/2018 1633   HCT 48.2 01/14/2013 1416   PLT 283.0 01/12/2018 1633   PLT 205 01/14/2013 1416   MCV 84.9 01/12/2018 1633   MCV 85 01/14/2013 1416   MCH 29.7 01/11/2014 1540   MCHC 33.5 01/12/2018 1633   RDW 13.7 01/12/2018 1633   RDW 14.0 01/14/2013 1416   LYMPHSABS 2.6 01/11/2014 1540   MONOABS 0.9 01/11/2014 1540   EOSABS 0.2 01/11/2014 1540   BASOSABS 0.1  01/11/2014 1540    Hgb A1C Lab Results  Component Value Date   HGBA1C 5.9 06/13/2018           Assessment & Plan:

## 2018-09-16 LAB — BASIC METABOLIC PANEL
BUN: 17 mg/dL (ref 7–25)
CO2: 24 mmol/L (ref 20–32)
Calcium: 9.5 mg/dL (ref 8.6–10.3)
Chloride: 106 mmol/L (ref 98–110)
Creat: 0.76 mg/dL (ref 0.60–1.35)
Glucose, Bld: 96 mg/dL (ref 65–99)
Potassium: 4.1 mmol/L (ref 3.5–5.3)
Sodium: 140 mmol/L (ref 135–146)

## 2018-09-16 LAB — LIPID PANEL
Cholesterol: 98 mg/dL (ref <200)
HDL: 29 mg/dL — ABNORMAL LOW (ref >=40)
LDL Cholesterol (Calc): 50 mg/dL (calc)
Non-HDL Cholesterol (Calc): 69 mg/dL (calc) (ref <130)
Total CHOL/HDL Ratio: 3.4 (calc) (ref <5.0)
Triglycerides: 103 mg/dL (ref <150)

## 2018-09-16 LAB — HEMOGLOBIN A1C
Hgb A1c MFr Bld: 5.8 % of total Hgb — ABNORMAL HIGH (ref <5.7)
Mean Plasma Glucose: 120 (calc)
eAG (mmol/L): 6.6 (calc)

## 2018-09-17 NOTE — Assessment & Plan Note (Signed)
A1C today Encouraged him to consume a low carb diet and exercise for weight loss Foot exam today Advised him to see an eye doctor annually He declines flu and pneumovax today

## 2018-09-17 NOTE — Assessment & Plan Note (Signed)
Controlled off meds Support offered today 

## 2018-09-17 NOTE — Assessment & Plan Note (Signed)
BMET and lipid profile today Encouraged him to consume a low fat diet Controlled off meds

## 2018-09-17 NOTE — Assessment & Plan Note (Signed)
Controlled off meds Will monitor Continue DASH diet and exercise for weight loss BMET today

## 2018-09-17 NOTE — Patient Instructions (Signed)
Fat and Cholesterol Restricted Eating Plan Getting too much fat and cholesterol in your diet may cause health problems. Choosing the right foods helps keep your fat and cholesterol at normal levels. This can keep you from getting certain diseases. Your doctor may recommend an eating plan that includes:  Total fat: ______% or less of total calories a day.  Saturated fat: ______% or less of total calories a day.  Cholesterol: less than _________mg a day.  Fiber: ______g a day. What are tips for following this plan? Meal planning  At meals, divide your plate into four equal parts: ? Fill one-half of your plate with vegetables and green salads. ? Fill one-fourth of your plate with whole grains. ? Fill one-fourth of your plate with low-fat (lean) protein foods.  Eat fish that is high in omega-3 fats at least two times a week. This includes mackerel, tuna, sardines, and salmon.  Eat foods that are high in fiber, such as whole grains, beans, apples, broccoli, carrots, peas, and barley. General tips   Work with your doctor to lose weight if you need to.  Avoid: ? Foods with added sugar. ? Fried foods. ? Foods with partially hydrogenated oils.  Limit alcohol intake to no more than 1 drink a day for nonpregnant women and 2 drinks a day for men. One drink equals 12 oz of beer, 5 oz of wine, or 1 oz of hard liquor. Reading food labels  Check food labels for: ? Trans fats. ? Partially hydrogenated oils. ? Saturated fat (g) in each serving. ? Cholesterol (mg) in each serving. ? Fiber (g) in each serving.  Choose foods with healthy fats, such as: ? Monounsaturated fats. ? Polyunsaturated fats. ? Omega-3 fats.  Choose grain products that have whole grains. Look for the word "whole" as the first word in the ingredient list. Cooking  Cook foods using low-fat methods. These include baking, boiling, grilling, and broiling.  Eat more home-cooked foods. Eat at restaurants and buffets  less often.  Avoid cooking using saturated fats, such as butter, cream, palm oil, palm kernel oil, and coconut oil. Recommended foods  Fruits  All fresh, canned (in natural juice), or frozen fruits. Vegetables  Fresh or frozen vegetables (raw, steamed, roasted, or grilled). Green salads. Grains  Whole grains, such as whole wheat or whole grain breads, crackers, cereals, and pasta. Unsweetened oatmeal, bulgur, barley, quinoa, or brown rice. Corn or whole wheat flour tortillas. Meats and other protein foods  Ground beef (85% or leaner), grass-fed beef, or beef trimmed of fat. Skinless chicken or turkey. Ground chicken or turkey. Pork trimmed of fat. All fish and seafood. Egg whites. Dried beans, peas, or lentils. Unsalted nuts or seeds. Unsalted canned beans. Nut butters without added sugar or oil. Dairy  Low-fat or nonfat dairy products, such as skim or 1% milk, 2% or reduced-fat cheeses, low-fat and fat-free ricotta or cottage cheese, or plain low-fat and nonfat yogurt. Fats and oils  Tub margarine without trans fats. Light or reduced-fat mayonnaise and salad dressings. Avocado. Olive, canola, sesame, or safflower oils. The items listed above may not be a complete list of foods and beverages you can eat. Contact a dietitian for more information. Foods to avoid Fruits  Canned fruit in heavy syrup. Fruit in cream or butter sauce. Fried fruit. Vegetables  Vegetables cooked in cheese, cream, or butter sauce. Fried vegetables. Grains  White bread. White pasta. White rice. Cornbread. Bagels, pastries, and croissants. Crackers and snack foods that contain trans fat   and hydrogenated oils. Meats and other protein foods  Fatty cuts of meat. Ribs, chicken wings, bacon, sausage, bologna, salami, chitterlings, fatback, hot dogs, bratwurst, and packaged lunch meats. Liver and organ meats. Whole eggs and egg yolks. Chicken and turkey with skin. Fried meat. Dairy  Whole or 2% milk, cream,  half-and-half, and cream cheese. Whole milk cheeses. Whole-fat or sweetened yogurt. Full-fat cheeses. Nondairy creamers and whipped toppings. Processed cheese, cheese spreads, and cheese curds. Beverages  Alcohol. Sugar-sweetened drinks such as sodas, lemonade, and fruit drinks. Fats and oils  Butter, stick margarine, lard, shortening, ghee, or bacon fat. Coconut, palm kernel, and palm oils. Sweets and desserts  Corn syrup, sugars, honey, and molasses. Candy. Jam and jelly. Syrup. Sweetened cereals. Cookies, pies, cakes, donuts, muffins, and ice cream. The items listed above may not be a complete list of foods and beverages you should avoid. Contact a dietitian for more information. Summary  Choosing the right foods helps keep your fat and cholesterol at normal levels. This can keep you from getting certain diseases.  At meals, fill one-half of your plate with vegetables and green salads.  Eat high-fiber foods, like whole grains, beans, apples, carrots, peas, and barley.  Limit added sugar, saturated fats, alcohol, and fried foods. This information is not intended to replace advice given to you by your health care provider. Make sure you discuss any questions you have with your health care provider. Document Released: 07/13/2011 Document Revised: 09/14/2017 Document Reviewed: 09/28/2016 Elsevier Patient Education  2020 [employer]  

## 2018-09-17 NOTE — Assessment & Plan Note (Signed)
Encouraged continued weight loss Continue CPAP for now

## 2018-09-18 ENCOUNTER — Ambulatory Visit (INDEPENDENT_AMBULATORY_CARE_PROVIDER_SITE_OTHER): Payer: BC Managed Care – PPO | Admitting: Internal Medicine

## 2018-09-18 DIAGNOSIS — Z720 Tobacco use: Secondary | ICD-10-CM

## 2018-09-18 DIAGNOSIS — G4733 Obstructive sleep apnea (adult) (pediatric): Secondary | ICD-10-CM | POA: Diagnosis not present

## 2018-09-18 DIAGNOSIS — F17210 Nicotine dependence, cigarettes, uncomplicated: Secondary | ICD-10-CM

## 2018-09-18 NOTE — Progress Notes (Signed)
[facility] Consultation    Virtual Visit via Video Note I connected with patient on 09/18/18 at  3:00 PM EDT by video and verified that I am speaking with the correct person using two identifiers.   I discussed the limitations, risks of performing an evaluation and management service by video and the availability of in person appointments. I also discussed with the patient that there may be a patient responsible charge related to this service.  In light of current covid-19 pandemic, patient also understands that we are trying to protect them by minimizing in office contact if at all possible.  The patient expressed understanding and agreed to proceed. Please see note below for further detail.    The patient was advised to call back or seek an in-person evaluation if the symptoms worsen or if the condition fails to improve as anticipated. I spent 15 minutes of face-to-face time during this visit.   Laverle Hobby, MD   Assessment and Plan:  Obstructive sleep apnea. -Severe with AHI of 70.  Currently on AutoPap pressure range 14-20 cmH2O. -Doing better with CPAP, with excellent control of sleep apnea, however compliance is suboptimal, encouraged greater use of CPAP.  Nicotine abuse. -Discussed importance of smoke cessation, spent greater than 3 minutes in discussion. - Currently is not interested in nicotine replacement, would like to quit on his own, but he is currently in the process of losing weight and would like to go a little bit further with that before trying to quit again.  Allergic rhinitis/bronchitis. -Was likely due to post-viral, now doing better and respiratory issues have resolved.    Date: 09/18/2018  MRN# [id number] Anthony Myers May 23, 1980  Referring Physician:  Dr. Garnette Gunner.   Anthony Myers is a 38 y.o. old male seen in consultation for chief complaint of: OSA.     HPI:  Anthony Myers is a 38 y.o. male smoker with OSA. At last visit he was  asked to continue CPAP at a pressure range of 14-20.  His compliance was suboptimal at that time, he was encouraged to increase use of CPAP.  Smoking cessation was also encouraged.    Since last visit he has been doing well, he has been losing weight and gotten off of medications. He is falling asleep in bed while watching television and then falls asleep after forgetting to put it on recently.  He has been vaping, down to about ppd with the cigs and then vapes.   Denies jaw problems, he has had tonsillectomy in the past, no history of TMJ.     **CPAP download data 08/16/2018-09/14/2018>> raw data personally reviewed.  Usage greater than 4 hours is 18/30 days.  Average usage on days used is 4 hours 18 minutes.  Pressure ranges 14-20.  Median pressure 15, 95th percentile pressure 18, maximum pressure 19.3.  Leaks are within normal limits.  Residual AHI is 1.  Overall this shows inadequate compliance with excellent control of obstructive sleep apnea. inadequate compliance with CPAP with excellent control of OSA when used.  **Download data 08/13/2017-09/11/2017>> raw data personally reviewed, usage greater than 4 hours is 13/30 days.  Average usage on days used is 3 hours 59 minutes.  Pressure is auto 14-20.  Median pressure 15, 93 percentile pressure 17, maximum pressure 18.  Residual AHI is 1.  Overall this test shows an adequate compliance with CPAP with excellent control of obstructive sleep apnea when used. **CPAP titration 06/27/2017>> recommended auto CPAP with pressure range 14-20. **Home sleep  test 05/30/2017>> severe obstructive sleep apnea with AHI 70.     Medication:   No current outpatient medications on file.   Allergies:  Tylenol [acetaminophen]  Review of Systems:  Constitutional: Feels well. Cardiovascular: Denies chest pain, exertional chest pain.  Pulmonary: Denies hemoptysis, pleuritic chest pain.   The remainder of systems were reviewed and were found to be negative other than  what is documented in the HPI.      LABORATORY PANEL:   CBC No results for input(s): WBC, HGB, HCT, PLT in the last 168 hours. ------------------------------------------------------------------------------------------------------------------  Chemistries  Recent Labs  Lab 09/15/18 1608  NA 140  K 4.1  CL 106  CO2 24  GLUCOSE 96  BUN 17  CREATININE 0.76  CALCIUM 9.5   ------------------------------------------------------------------------------------------------------------------  Cardiac Enzymes No results for input(s): TROPONINI in the last 168 hours. ------------------------------------------------------------  RADIOLOGY:  No results found.     Thank  you for the consultation and for [facility] to assist in the care of your patient. Our recommendations are noted above.  Please contact us if we can be of further service.  Wells Guileseep Trino Higinbotham, M.D., F.C.C.P.  Board Certified in Internal Medicine, Pulmonary Medicine, Critical Care Medicine, and Sleep Medicine.  [facility] Office Number: [phone]   09/18/2018

## 2018-09-18 NOTE — Patient Instructions (Signed)
Continue to use cpap every night for the whole night.  Try to reduce by 1 cigarette per day.

## 2019-02-12 ENCOUNTER — Encounter: Payer: Self-pay | Admitting: Internal Medicine

## 2019-02-12 ENCOUNTER — Ambulatory Visit (INDEPENDENT_AMBULATORY_CARE_PROVIDER_SITE_OTHER): Payer: BC Managed Care – PPO | Admitting: Internal Medicine

## 2019-02-12 ENCOUNTER — Other Ambulatory Visit: Payer: Self-pay

## 2019-02-12 VITALS — BP 122/82 | HR 92 | Temp 98.0°F | Ht 67.75 in | Wt 275.0 lb

## 2019-02-12 DIAGNOSIS — I10 Essential (primary) hypertension: Secondary | ICD-10-CM

## 2019-02-12 DIAGNOSIS — F329 Major depressive disorder, single episode, unspecified: Secondary | ICD-10-CM

## 2019-02-12 DIAGNOSIS — F419 Anxiety disorder, unspecified: Secondary | ICD-10-CM | POA: Diagnosis not present

## 2019-02-12 DIAGNOSIS — Z0000 Encounter for general adult medical examination without abnormal findings: Secondary | ICD-10-CM

## 2019-02-12 DIAGNOSIS — G4733 Obstructive sleep apnea (adult) (pediatric): Secondary | ICD-10-CM

## 2019-02-12 DIAGNOSIS — E119 Type 2 diabetes mellitus without complications: Secondary | ICD-10-CM | POA: Diagnosis not present

## 2019-02-12 DIAGNOSIS — E782 Mixed hyperlipidemia: Secondary | ICD-10-CM

## 2019-02-12 NOTE — Progress Notes (Signed)
Subjective:    Patient ID: Anthony Myers, male    DOB: 07/05/1980, 39 y.o.   MRN: [id number]  HPI  Pt presents to the clinic today for his annual exam. He is also dud to follow up chronic conditions.   GERD: Triggered by spicy foods.  He takes Zegrid OTC with good relief. There is no upper GI on file.   HTN: BP today 122/82. He is not taking any antihypertensive medications at this time. ECG from 02/2015.   HLD: His last LDL was 50, 08/2018. He is not taking any cholesterol lowering medication at this time.   DM 2: His last A1C was 5.8%, 08/2018. He does not check his sugars. He is not taking any diabetic medication at this time. He checks his feet routinely.   OSA: He averages 4-5 hours per sleep at night with the use of his CPAP. Sleep study from 05/2017 reviewed.  Anxiety and Depression: Resolved now that he is done with his divorce/custody battle. He is not currently seeing a therapist.    Flu: never Tetanus: 09/2014  Pneumovax: never Dentist: annually Eye doctor: as needed  Diet: Eats meat and veggies. Rarely eats fruit, sweets, or fried foods. Drinks mostly water throughout the day, energy drink in AM, and diet soda with dinner.  Exercise: Exercise with work.   Review of Systems  Past Medical History:  Diagnosis Date  . Anxiety   . Depression   . Insomnia     No current outpatient medications on file.   No current facility-administered medications for this visit.    Allergies  Allergen Reactions  . Tylenol [Acetaminophen] Swelling    "feels bad"    Family History  Problem Relation Age of Onset  . Mental illness Mother   . Hypertension Father     Social History   Socioeconomic History  . Marital status: Married    Spouse name: Not on file  . Number of children: Not on file  . Years of education: Not on file  . Highest education level: Not on file  Occupational History  . Not on file  Tobacco Use  . Smoking status: Former Games developer  . Smokeless tobacco:  Never Used  Substance and Sexual Activity  . Alcohol use: No    Alcohol/week: 0.0 standard drinks  . Drug use: No  . Sexual activity: Not on file  Other Topics Concern  . Not on file  Social History Narrative  . Not on file   Social Determinants of Health   Financial Resource Strain:   . Difficulty of Paying Living Expenses: Not on file  Food Insecurity:   . Worried About Programme researcher, broadcasting/film/video in the Last Year: Not on file  . Ran Out of Food in the Last Year: Not on file  Transportation Needs:   . Lack of Transportation (Medical): Not on file  . Lack of Transportation (Non-Medical): Not on file  Physical Activity:   . Days of Exercise per Week: Not on file  . Minutes of Exercise per Session: Not on file  Stress:   . Feeling of Stress : Not on file  Social Connections:   . Frequency of Communication with Friends and Family: Not on file  . Frequency of Social Gatherings with Friends and Family: Not on file  . Attends Religious Services: Not on file  . Active Member of Clubs or Organizations: Not on file  . Attends Banker Meetings: Not on file  . Marital Status: Not  on file  Intimate Partner Violence:   . Fear of Current or Ex-Partner: Not on file  . Emotionally Abused: Not on file  . Physically Abused: Not on file  . Sexually Abused: Not on file     Constitutional: Denies fever, malaise, fatigue, headache or abrupt weight changes.  HEENT: Denies eye pain, eye redness, ear pain, ringing in the ears, wax buildup, runny nose, nasal congestion, bloody nose, or sore throat. Respiratory: Denies difficulty breathing, shortness of breath, cough or sputum production.   Cardiovascular: Denies chest pain, chest tightness, palpitations or swelling in the hands or feet.  Gastrointestinal: Denies abdominal pain, bloating, constipation, diarrhea or blood in the stool.  GU: Denies urgency, frequency, pain with urination, burning sensation, blood in urine, odor or  discharge. Musculoskeletal: Denies decrease in range of motion, difficulty with gait, muscle pain or joint pain and swelling.  Skin: Denies redness, rashes, lesions or ulcercations.  Neurological: Denies dizziness, difficulty with memory, difficulty with speech or problems with balance and coordination.  Psych: Denies anxiety, depression, SI/HI.  No other specific complaints in a complete review of systems (except as listed in HPI above).     Objective:   Physical Exam  BP 122/82   Pulse 92   Temp 98 F (36.7 C) (Temporal)   Ht 5' 7.75" (1.721 m)   Wt 275 lb (124.7 kg)   SpO2 97%   BMI 42.12 kg/m   Wt Readings from Last 3 Encounters:  09/15/18 292 lb (132.5 kg)  06/14/18 275 lb (124.7 kg)  01/12/18 276 lb (125.2 kg)    General: Appears hisstated age, obese, in NAD. Skin: Warm, dry and intact. No ulcerations noted. 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 Cardiovascular: Normal rate and rhythm. S1,S2 noted.  No murmur, rubs or gallops noted. No JVD or BLE edema.  Pulmonary/Chest: Normal effort and positive vesicular breath sounds. No respiratory distress. No wheezes, rales or ronchi noted.  Abdomen: Soft and nontender. Normal bowel sounds. No distention or masses noted. Liver, spleen and kidneys non palpable. Musculoskeletal: Strength 5/5 BUE/BLE. No difficulty with gait.  Neurological: Alert and oriented. Cranial nerves II-XII grossly intact. Coordination normal.  Psychiatric: Mood and affect normal. Behavior is normal. Judgment and thought content normal.    BMET    Component Value Date/Time   NA 140 09/15/2018 1608   NA 142 01/14/2013 1416   K 4.1 09/15/2018 1608   K 4.1 01/14/2013 1416   CL 106 09/15/2018 1608   CL 109 (H) 01/14/2013 1416   CO2 24 09/15/2018 1608   CO2 27 01/14/2013 1416   GLUCOSE 96 09/15/2018 1608   GLUCOSE 113 (H) 01/14/2013 1416    BUN 17 09/15/2018 1608   BUN 11 01/14/2013 1416   CREATININE 0.76 09/15/2018 1608   CALCIUM 9.5 09/15/2018 1608   CALCIUM 9.3 01/14/2013 1416   GFRNONAA >60 01/14/2013 1416   GFRAA >60 01/14/2013 1416    Lipid Panel     Component Value Date/Time   CHOL 98 09/15/2018 1608   TRIG 103 09/15/2018 1608   HDL 29 (L) 09/15/2018 1608   CHOLHDL 3.4 09/15/2018 1608   VLDL 14.4 06/13/2018 0941   LDLCALC 50 09/15/2018 1608    CBC    Component Value Date/Time   WBC 11.1 (H) 01/12/2018 1633   RBC 5.44 01/12/2018 1633   HGB 15.5 01/12/2018 1633   HGB 15.5 01/14/2013 1416  HCT 46.2 01/12/2018 1633   HCT 48.2 01/14/2013 1416   PLT 283.0 01/12/2018 1633   PLT 205 01/14/2013 1416   MCV 84.9 01/12/2018 1633   MCV 85 01/14/2013 1416   MCH 29.7 01/11/2014 1540   MCHC 33.5 01/12/2018 1633   RDW 13.7 01/12/2018 1633   RDW 14.0 01/14/2013 1416   LYMPHSABS 2.6 01/11/2014 1540   MONOABS 0.9 01/11/2014 1540   EOSABS 0.2 01/11/2014 1540   BASOSABS 0.1 01/11/2014 1540    Hgb A1C Lab Results  Component Value Date   HGBA1C 5.8 (H) 09/15/2018           Assessment & Plan:  Preventative Health Maintenance:  He declines flu shot today Tetanus: UTD He declines pneumovax today Encouraged to consume a balanced diet and exercise regimen. Advised to see a dentist annually.  Will check CBC, CMET, lipids, A1C, and microalbumin.   RTC in 6 months, for follow up chronic conditions  Webb Silversmith, NP This visit occurred during the SARS-CoV-2 public health emergency.  Safety protocols were in place, including screening questions prior to the visit, additional usage of staff PPE, and extensive cleaning of exam room while observing appropriate contact time as indicated for disinfecting solutions.

## 2019-02-13 LAB — CBC
HCT: 47.5 % (ref 39.0–52.0)
Hemoglobin: 15.8 g/dL (ref 13.0–17.0)
MCHC: 33.3 g/dL (ref 30.0–36.0)
MCV: 85.4 fl (ref 78.0–100.0)
Platelets: 222 10*3/uL (ref 150.0–400.0)
RBC: 5.56 Mil/uL (ref 4.22–5.81)
RDW: 13.9 % (ref 11.5–15.5)
WBC: 12.8 10*3/uL — ABNORMAL HIGH (ref 4.0–10.5)

## 2019-02-13 LAB — COMPREHENSIVE METABOLIC PANEL
ALT: 30 U/L (ref 0–53)
AST: 25 U/L (ref 0–37)
Albumin: 4.8 g/dL (ref 3.5–5.2)
Alkaline Phosphatase: 86 U/L (ref 39–117)
BUN: 12 mg/dL (ref 6–23)
CO2: 27 mEq/L (ref 19–32)
Calcium: 9.8 mg/dL (ref 8.4–10.5)
Chloride: 103 mEq/L (ref 96–112)
Creatinine, Ser: 0.76 mg/dL (ref 0.40–1.50)
GFR: 114.46 mL/min (ref >60.00)
Glucose, Bld: 79 mg/dL (ref 70–99)
Potassium: 4.4 mEq/L (ref 3.5–5.1)
Sodium: 139 mEq/L (ref 135–145)
Total Bilirubin: 0.4 mg/dL (ref 0.2–1.2)
Total Protein: 7.3 g/dL (ref 6.0–8.3)

## 2019-02-13 LAB — LIPID PANEL
Cholesterol: 108 mg/dL (ref 0–200)
HDL: 37.8 mg/dL — ABNORMAL LOW (ref >39.00)
LDL Cholesterol: 44 mg/dL (ref 0–99)
NonHDL: 69.7
Total CHOL/HDL Ratio: 3
Triglycerides: 127 mg/dL (ref 0.0–149.0)
VLDL: 25.4 mg/dL (ref 0.0–40.0)

## 2019-02-13 LAB — HEMOGLOBIN A1C: Hgb A1c MFr Bld: 5.7 % (ref 4.6–6.5)

## 2019-02-13 LAB — MICROALBUMIN / CREATININE URINE RATIO
Creatinine,U: 121.5 mg/dL
Microalb Creat Ratio: 6.2 mg/g (ref 0.0–30.0)
Microalb, Ur: 7.5 mg/dL — ABNORMAL HIGH (ref 0.0–1.9)

## 2019-02-13 NOTE — Patient Instructions (Signed)

## 2019-02-13 NOTE — Assessment & Plan Note (Signed)
CMET and Lipid profile today °Encouraged him to consume a low fat diet °

## 2019-02-13 NOTE — Assessment & Plan Note (Signed)
Resolved  Will monitor

## 2019-02-13 NOTE — Assessment & Plan Note (Signed)
Encouraged continued weight loss Continue to wear CPAP

## 2019-02-13 NOTE — Assessment & Plan Note (Signed)
Controlled off meds  Will monitor 

## 2019-02-13 NOTE — Assessment & Plan Note (Signed)
A1C and urine microalbumin today Encouraged him to consume a low carb diet, exercise for weight loss Foot exam today Encouraged yearly eye exam He declines flu shot He declines pneumovax

## 2019-02-14 MED ORDER — LISINOPRIL 2.5 MG PO TABS: 2.5000 mg | ORAL_TABLET | Freq: Every day | ORAL | 1 refills | Status: DC

## 2019-02-14 NOTE — Addendum Note (Signed)
Addended by: Roena Malady on: 02/14/2019 11:10 AM   Modules accepted: Orders

## 2019-05-28 ENCOUNTER — Ambulatory Visit (INDEPENDENT_AMBULATORY_CARE_PROVIDER_SITE_OTHER): Payer: BC Managed Care – PPO | Admitting: Internal Medicine

## 2019-05-28 ENCOUNTER — Encounter: Payer: Self-pay | Admitting: Internal Medicine

## 2019-05-28 DIAGNOSIS — J301 Allergic rhinitis due to pollen: Secondary | ICD-10-CM | POA: Diagnosis not present

## 2019-05-28 DIAGNOSIS — B9789 Other viral agents as the cause of diseases classified elsewhere: Secondary | ICD-10-CM | POA: Diagnosis not present

## 2019-05-28 DIAGNOSIS — J329 Chronic sinusitis, unspecified: Secondary | ICD-10-CM | POA: Diagnosis not present

## 2019-05-28 MED ORDER — PREDNISONE 10 MG PO TABS: ORAL_TABLET | ORAL | 0 refills | Status: DC

## 2019-05-28 MED ORDER — HYDROCODONE-HOMATROPINE 5-1.5 MG/5ML PO SYRP: 5.0000 mL | ORAL_SOLUTION | Freq: Three times a day (TID) | ORAL | 0 refills | Status: DC | PRN

## 2019-05-28 MED ORDER — ALBUTEROL SULFATE HFA 108 (90 BASE) MCG/ACT IN AERS: 2.0000 | INHALATION_SPRAY | Freq: Four times a day (QID) | RESPIRATORY_TRACT | 0 refills | Status: DC | PRN

## 2019-05-28 NOTE — Patient Instructions (Signed)
Allergic Rhinitis, Adult Allergic rhinitis is a reaction to allergens in the air. Allergens are tiny specks (particles) in the air that cause your body to have an allergic reaction. This condition cannot be passed from person to person (is not contagious). Allergic rhinitis cannot be cured, but it can be controlled. There are two types of allergic rhinitis:  Seasonal. This type is also called hay fever. It happens only during certain times of the year.  Perennial. This type can happen at any time of the year. What are the causes? This condition may be caused by:  Pollen from grasses, trees, and weeds.  House dust mites.  Pet dander.  Mold. What are the signs or symptoms? Symptoms of this condition include:  Sneezing.  Runny or stuffy nose (nasal congestion).  A lot of mucus in the back of the throat (postnasal drip).  Itchy nose.  Tearing of the eyes.  Trouble sleeping.  Being sleepy during day. How is this treated? There is no cure for this condition. You should avoid things that trigger your symptoms (allergens). Treatment can help to relieve symptoms. This may include:  Medicines that block allergy symptoms, such as antihistamines. These may be given as a shot, nasal spray, or pill.  Shots that are given until your body becomes less sensitive to the allergen (desensitization).  Stronger medicines, if all other treatments have not worked. Follow these instructions at home: Avoiding allergens   Find out what you are allergic to. Common allergens include smoke, dust, and pollen.  Avoid them if you can. These are some of the things that you can do to avoid allergens: ? Replace carpet with wood, tile, or vinyl flooring. Carpet can trap dander and dust. ? Clean any mold found in the home. ? Do not smoke. Do not allow smoking in your home. ? Change your heating and air conditioning filter at least once a month. ? During allergy season:  Keep windows closed as much as  you can. If possible, use air conditioning when there is a lot of pollen in the air.  Use a special filter for allergies with your furnace and air conditioner.  Plan outdoor activities when pollen counts are lowest. This is usually during the early morning or evening hours.  If you do go outdoors when pollen count is high, wear a special mask for people with allergies.  When you come indoors, take a shower and change your clothes before sitting on furniture or bedding. General instructions  Do not use fans in your home.  Do not hang clothes outside to dry.  Wear sunglasses to keep pollen out of your eyes.  Wash your hands right away after you touch household pets.  Take over-the-counter and prescription medicines only as told by your doctor.  Keep all follow-up visits as told by your doctor. This is important. Contact a doctor if:  You have a fever.  You have a cough that does not go away (is persistent).  You start to make whistling sounds when you breathe (wheeze).  Your symptoms do not get better with treatment.  You have thick fluid coming from your nose.  You start to have nosebleeds. Get help right away if:  Your tongue or your lips are swollen.  You have trouble breathing.  You feel dizzy or you feel like you are going to pass out (faint).  You have cold sweats. Summary  Allergic rhinitis is a reaction to allergens in the air.  This condition may be   caused by allergens. These include pollen, dust mites, pet dander, and mold.  Symptoms include a runny, itchy nose, sneezing, or tearing eyes. You may also have trouble sleeping or feel sleepy during the day.  Treatment includes taking medicines and avoiding allergens. You may also get shots or take stronger medicines.  Get help if you have a fever or a cough that does not stop. Get help right away if you are short of breath. This information is not intended to replace advice given to you by your health care  provider. Make sure you discuss any questions you have with your health care provider. Document Revised: 05/02/2018 Document Reviewed: 08/02/2017 Elsevier Patient Education  2020 Elsevier Inc.  

## 2019-05-28 NOTE — Progress Notes (Signed)
Virtual Visit via Video Note  I connected with Anthony Myers on 05/28/19 at  4:15 PM EDT by a video enabled telemedicine application and verified that I am speaking with the correct person using two identifiers.  Location: Patient: Home Provider: Office   I discussed the limitations of evaluation and management by telemedicine and the availability of in person appointments. The patient expressed understanding and agreed to proceed.  History of Present Illness:  Pt reports ethmoid sinus pressure, nasal congestion, cough and chest tightness. He reports the sinus pressure started 3 days ago. He then mowed the grass Friday evening and symptoms worsened Saturday morning. He reports some associated lightheadedness with the sinus pressure but denies headache or visual changes. He is blowing clear mucous out of his nose. The cough is nonproductive, worse at night. He has some mild SOB associated with the chest tightness but denies chest pain. He denies runny nose, ear pain, or sore throat. He has some decreased taste and smell but denies fever, chills or body aches. He does smoke. He has tried Advil Sinus, Sudafed and Nyquil with minimal relief. He has not had sick contacts or exposure to Covid that he is aware of.   Past Medical History:  Diagnosis Date  . Anxiety   . Depression   . Insomnia     Current Outpatient Medications  Medication Sig Dispense Refill  . lisinopril (ZESTRIL) 2.5 MG tablet Take 1 tablet (2.5 mg total) by mouth daily. 90 tablet 1   No current facility-administered medications for this visit.    Allergies  Allergen Reactions  . Tylenol [Acetaminophen] Swelling    "feels bad"    Family History  Problem Relation Age of Onset  . Mental illness Mother   . Hypertension Father     Social History   Socioeconomic History  . Marital status: Married    Spouse name: Not on file  . Number of children: Not on file  . Years of education: Not on file  . Highest education  level: Not on file  Occupational History  . Not on file  Tobacco Use  . Smoking status: Former Research scientist (life sciences)  . Smokeless tobacco: Never Used  Substance and Sexual Activity  . Alcohol use: No    Alcohol/week: 0.0 standard drinks  . Drug use: No  . Sexual activity: Not on file  Other Topics Concern  . Not on file  Social History Narrative  . Not on file   Social Determinants of Health   Financial Resource Strain:   . Difficulty of Paying Living Expenses:   Food Insecurity:   . Worried About Charity fundraiser in the Last Year:   . Arboriculturist in the Last Year:   Transportation Needs:   . Film/video editor (Medical):   Marland Kitchen Lack of Transportation (Non-Medical):   Physical Activity:   . Days of Exercise per Week:   . Minutes of Exercise per Session:   Stress:   . Feeling of Stress :   Social Connections:   . Frequency of Communication with Friends and Family:   . Frequency of Social Gatherings with Friends and Family:   . Attends Religious Services:   . Active Member of Clubs or Organizations:   . Attends Archivist Meetings:   Marland Kitchen Marital Status:   Intimate Partner Violence:   . Fear of Current or Ex-Partner:   . Emotionally Abused:   Marland Kitchen Physically Abused:   . Sexually Abused:  Constitutional: Denies fever, malaise, fatigue, headache or abrupt weight changes.  HEENT: Pt reports sinus pressure, nasal congestion, decreased taste/smell. Denies eye pain, eye redness, ear pain, ringing in the ears, wax buildup, runny nose, bloody nose, or sore throat. Respiratory: Pt reports cough, mild SOB. Denies difficulty breathing, or sputum production.   Cardiovascular: Pt reports chest tightness. Denies chest pain, palpitations or swelling in the hands or feet.  Neurological: Pt reports lightheadedness. Denies dizziness, difficulty with memory, difficulty with speech or problems with balance and coordination.    No other specific complaints in a complete review of  systems (except as listed in HPI above).    Observations/Objective:   Wt Readings from Last 3 Encounters:  02/12/19 275 lb (124.7 kg)  09/15/18 292 lb (132.5 kg)  06/14/18 275 lb (124.7 kg)    General: Appears his stated age, in NAD. HEENT: Head: normal shape and size, pt reports ethmoid sinus pressure; Nose: congestion noted; Throat/Mouth: hoarseness noted.  Pulmonary/Chest: Normal effort. No respiratory distress.  Neurological: Alert and oriented.     BMET    Component Value Date/Time   NA 139 02/12/2019 1546   NA 142 01/14/2013 1416   K 4.4 02/12/2019 1546   K 4.1 01/14/2013 1416   CL 103 02/12/2019 1546   CL 109 (H) 01/14/2013 1416   CO2 27 02/12/2019 1546   CO2 27 01/14/2013 1416   GLUCOSE 79 02/12/2019 1546   GLUCOSE 113 (H) 01/14/2013 1416   BUN 12 02/12/2019 1546   BUN 11 01/14/2013 1416   CREATININE 0.76 02/12/2019 1546   CREATININE 0.76 09/15/2018 1608   CALCIUM 9.8 02/12/2019 1546   CALCIUM 9.3 01/14/2013 1416   GFRNONAA >60 01/14/2013 1416   GFRAA >60 01/14/2013 1416    Lipid Panel     Component Value Date/Time   CHOL 108 02/12/2019 1546   TRIG 127.0 02/12/2019 1546   HDL 37.80 (L) 02/12/2019 1546   CHOLHDL 3 02/12/2019 1546   VLDL 25.4 02/12/2019 1546   LDLCALC 44 02/12/2019 1546   LDLCALC 50 09/15/2018 1608    CBC    Component Value Date/Time   WBC 12.8 (H) 02/12/2019 1546   RBC 5.56 02/12/2019 1546   HGB 15.8 02/12/2019 1546   HGB 15.5 01/14/2013 1416   HCT 47.5 02/12/2019 1546   HCT 48.2 01/14/2013 1416   PLT 222.0 02/12/2019 1546   PLT 205 01/14/2013 1416   MCV 85.4 02/12/2019 1546   MCV 85 01/14/2013 1416   MCH 29.7 01/11/2014 1540   MCHC 33.3 02/12/2019 1546   RDW 13.9 02/12/2019 1546   RDW 14.0 01/14/2013 1416   LYMPHSABS 2.6 01/11/2014 1540   MONOABS 0.9 01/11/2014 1540   EOSABS 0.2 01/11/2014 1540   BASOSABS 0.1 01/11/2014 1540    Hgb A1C Lab Results  Component Value Date   HGBA1C 5.7 02/12/2019        Assessment and Plan:  Allergic Rhinitis/Viral Sinusitis:  Start Zyrtec 10 mg daily at bedtime x 1 week Start Flonase 1 spray each nostril BID x 3 days then daily in am RX for Pred Taper to start Friday if symptoms worsen- this may drive blood sugars up RX for Albuterol 1-2 puff Q6H as needed RX for Hycodan for cough  Return precautions discussed  Follow Up Instructions:    I discussed the assessment and treatment plan with the patient. The patient was provided an opportunity to ask questions and all were answered. The patient agreed with the plan and demonstrated an  understanding of the instructions.   The patient was advised to call back or seek an in-person evaluation if the symptoms worsen or if the condition fails to improve as anticipated.     Nicki Reaper, NP

## 2019-06-20 ENCOUNTER — Other Ambulatory Visit: Payer: Self-pay | Admitting: Internal Medicine

## 2019-06-20 NOTE — Telephone Encounter (Signed)
This was for an acute visit 05/28/2019, please advise if okay to refill

## 2019-08-20 ENCOUNTER — Other Ambulatory Visit: Payer: Self-pay | Admitting: Internal Medicine

## 2019-09-26 ENCOUNTER — Ambulatory Visit: Payer: BC Managed Care – PPO | Admitting: Internal Medicine

## 2019-10-04 ENCOUNTER — Ambulatory Visit: Payer: BC Managed Care – PPO | Admitting: Pulmonary Disease

## 2019-10-08 ENCOUNTER — Ambulatory Visit: Payer: BC Managed Care – PPO | Admitting: Internal Medicine

## 2020-03-06 ENCOUNTER — Telehealth: Payer: Self-pay

## 2020-03-06 ENCOUNTER — Emergency Department
Admission: EM | Admit: 2020-03-06 | Discharge: 2020-03-06 | Disposition: A | Discharge status: Home / Self Care | Payer: BC Managed Care – PPO | Attending: Emergency Medicine | Admitting: Emergency Medicine

## 2020-03-06 ENCOUNTER — Emergency Department: Payer: BC Managed Care – PPO

## 2020-03-06 ENCOUNTER — Other Ambulatory Visit: Payer: Self-pay

## 2020-03-06 DIAGNOSIS — M5416 Radiculopathy, lumbar region: Secondary | ICD-10-CM | POA: Diagnosis present

## 2020-03-06 DIAGNOSIS — I10 Essential (primary) hypertension: Secondary | ICD-10-CM | POA: Insufficient documentation

## 2020-03-06 DIAGNOSIS — E119 Type 2 diabetes mellitus without complications: Secondary | ICD-10-CM | POA: Diagnosis not present

## 2020-03-06 DIAGNOSIS — Z87891 Personal history of nicotine dependence: Secondary | ICD-10-CM | POA: Diagnosis not present

## 2020-03-06 DIAGNOSIS — M5430 Sciatica, unspecified side: Secondary | ICD-10-CM

## 2020-03-06 DIAGNOSIS — Z79899 Other long term (current) drug therapy: Secondary | ICD-10-CM | POA: Insufficient documentation

## 2020-03-06 LAB — URINALYSIS, COMPLETE (UACMP) WITH MICROSCOPIC
Bilirubin Urine: NEGATIVE
Glucose, UA: NEGATIVE mg/dL
Hgb urine dipstick: NEGATIVE
Ketones, ur: NEGATIVE mg/dL
Leukocytes,Ua: NEGATIVE
Nitrite: NEGATIVE
Protein, ur: NEGATIVE mg/dL
Specific Gravity, Urine: 1.009 (ref 1.005–1.030)
pH: 7 (ref 5.0–8.0)

## 2020-03-06 MED ORDER — ORPHENADRINE CITRATE ER 100 MG PO TB12: 100.0000 mg | ORAL_TABLET | Freq: Two times a day (BID) | ORAL | 0 refills | Status: DC

## 2020-03-06 MED ORDER — NAPROXEN 500 MG PO TABS: 500.0000 mg | ORAL_TABLET | Freq: Two times a day (BID) | ORAL | Status: DC

## 2020-03-06 NOTE — Telephone Encounter (Signed)
Patient is currently in ED at [facility].

## 2020-03-06 NOTE — ED Provider Notes (Signed)
[facility] Emergency Department Provider Note   ____________________________________________   Event Date/Time   First MD Initiated Contact with Patient 03/06/20 [phone]     (approximate)  I have reviewed the triage vital signs and the nursing notes.   HISTORY  Chief Complaint Back Pain    HPI Anthony Myers is a 40 y.o. male patient presents with radicular back pain to the lower extremities.  Onset of complaint after masturbating last night.  Patient denies bladder or bowel dysfunction.  Patient stated there is a tingling sensation bilateral lower extremities which has improved since arrival.  Denies pain at this time.      Past Medical History:  Diagnosis Date  . Anxiety   . Depression   . Insomnia     Patient Active Problem List   Diagnosis Date Noted  . HLD (hyperlipidemia) 01/12/2018  . DM (diabetes mellitus), type 2 (HCC) 07/06/2017  . OSA (obstructive sleep apnea) 04/17/2017  . Anxiety and depression 05/17/2013  . HTN (hypertension) 03/09/2013    Past Surgical History:  Procedure Laterality Date  . TONSILLECTOMY      Prior to Admission medications   Medication Sig Start Date End Date Taking? Authorizing Provider  albuterol (VENTOLIN HFA) 108 (90 Base) MCG/ACT inhaler Inhale 2 puffs into the lungs every 6 (six) hours as needed for wheezing or shortness of breath. 05/28/19   Lorre Munroe, NP  HYDROcodone-homatropine (HYCODAN) 5-1.5 MG/5ML syrup Take 5 mLs by mouth every 8 (eight) hours as needed for cough. 05/28/19   Lorre Munroe, NP  lisinopril (ZESTRIL) 2.5 MG tablet TAKE 1 TABLET BY MOUTH EVERY DAY 08/20/19   Lorre Munroe, NP  predniSONE (DELTASONE) 10 MG tablet Take 3 tabs on days 1-2, take 2 tabs on days 3-4, take 1 tab on days 5-6 05/28/19   Lorre Munroe, NP    Allergies Tylenol [acetaminophen]  Family History  Problem Relation Age of Onset  . Mental illness Mother   . Hypertension Father     Social History Social  History   Tobacco Use  . Smoking status: Former Games developer  . Smokeless tobacco: Never Used  Substance Use Topics  . Alcohol use: No    Alcohol/week: 0.0 standard drinks  . Drug use: No    Review of Systems Constitutional: No fever/chills Eyes: No visual changes. ENT: No sore throat. Cardiovascular: Denies chest pain. Respiratory: Denies shortness of breath.  OSA. Gastrointestinal: No abdominal pain.  No nausea, no vomiting.  No diarrhea.  No constipation. Genitourinary: Negative for dysuria. Musculoskeletal: Negative for back pain. Skin: Negative for rash. Neurological: Negative for headaches, focal weakness or numbness. Psychiatric:  Anxiety and depression. Endocrine:  Diabetes, hyperlipidemia, and hypertension. Allergic/Immunilogical: Tylenol. ____________________________________________   PHYSICAL EXAM:  VITAL SIGNS: ED Triage Vitals  Enc Vitals Group     BP 03/06/20 0859 139/86     Pulse Rate 03/06/20 0859 67     Resp 03/06/20 0900 16     Temp --      Temp src --      SpO2 --      Weight 03/06/20 0903 245 lb (111.1 kg)     Height 03/06/20 0900 5\' 8"  (1.727 m)     Head Circumference --      Peak Flow --      Pain Score 03/06/20 0900 0     Pain Loc --      Pain Edu? --      Excl. in GC? --  Constitutional: Alert and oriented. Well appearing and in no acute distress.  BMI is 37.25. Eyes: Conjunctivae are normal. PERRL. EOMI. Head: Atraumatic. Nose: No congestion/rhinnorhea. Mouth/Throat: Mucous membranes are moist.  Oropharynx non-erythematous. Neck: No cervical spine tenderness to palpation. Hematological/Lymphatic/Immunilogical: No cervical lymphadenopathy. Cardiovascular: Normal rate, regular rhythm. Grossly normal heart sounds.  Good peripheral circulation. Respiratory: Normal respiratory effort.  No retractions. Lungs CTAB. Gastrointestinal: Soft and nontender. No distention. No abdominal bruits. No CVA tenderness. Genitourinary:  Deferred Musculoskeletal: No lower extremity tenderness nor edema.  No joint effusions. Neurologic:  Normal speech and language. No gross focal neurologic deficits are appreciated. No gait instability. Skin:  Skin is warm, dry and intact. No rash noted. Psychiatric: Mood and affect are normal. Speech and behavior are normal.  ____________________________________________   LABS (all labs ordered are listed, but only abnormal results are displayed)  Labs Reviewed  URINALYSIS, COMPLETE (UACMP) WITH MICROSCOPIC - Abnormal; Notable for the following components:      Result Value   Color, Urine STRAW (*)    APPearance CLEAR (*)    All other components within normal limits   ____________________________________________  EKG   ____________________________________________  RADIOLOGY I, Joni Reining, personally viewed and evaluated these images (plain radiographs) as part of my medical decision making, as well as reviewing the written report by the radiologist.  ED MD interpretation: Mild degenerative changes lumbar spine.  Official radiology report(s): DG Lumbar Spine Complete  Result Date: 03/06/2020 CLINICAL DATA:  Low back pain radiating into the legs, initial encounter EXAM: LUMBAR SPINE - COMPLETE 4+ VIEW COMPARISON:  None. FINDINGS: Five lumbar type vertebral bodies are well visualized. Vertebral body height is well maintained. L5 pars defects are noted bilaterally with mild anterolisthesis of L5 on S1. Mild osteophytic changes are noted in the upper lumbar spine. No soft tissue abnormality is seen. 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 Electronically Signed   By: Alcide Clever M.D.   On: 03/06/2020 10:23    ____________________________________________   PROCEDURES  Procedure(s) performed (including Critical Care):  Procedures   ____________________________________________   INITIAL IMPRESSION / ASSESSMENT AND PLAN / ED COURSE  As part of my  medical decision making, I reviewed the following data within the electronic MEDICAL RECORD NUMBER         Patient presents radicular low back pain.  Denies bladder or bowel dysfunction.  Patient states complaint has resolved since arrival.  Discussed no acute findings on x-ray.  Patient given discharge care instruction advised take medication as directed.  Return to ED if condition worsens.      ____________________________________________   FINAL CLINICAL IMPRESSION(S) / ED DIAGNOSES  Final diagnoses:  None     ED Discharge Orders    None      *Please note:  Anthony Myers was evaluated in Emergency Department on 03/06/2020 for the symptoms described in the history of present illness. He was evaluated in the context of the global COVID-19 pandemic, which necessitated consideration that the patient might be at risk for infection with the SARS-CoV-2 virus that causes COVID-19. Institutional protocols and algorithms that pertain to the evaluation of patients at risk for COVID-19 are in a state of rapid change based on information released by regulatory bodies including the CDC and federal and state organizations. These policies and algorithms were followed during the patient's care in the ED.  Some ED evaluations and interventions may be delayed as a result of limited staffing during and the pandemic.*   Note:  This document was prepared using Dragon voice recognition software and may include unintentional dictation errors.    Joni Reining, PA-C 03/06/20 1127    Concha Se, MD 03/07/20 [phone]

## 2020-03-06 NOTE — ED Triage Notes (Signed)
Pt states he has numbness to BL LE when he is laying flat and sometimes when he is in a sitting position , states it feels like it starts in the lower back.

## 2020-03-06 NOTE — Telephone Encounter (Signed)
Pt contacted office because he wanted to express his dissatisfaction with this office that he was sent to the ER for muscle spasms. He reported he was charged $250 and he could have had the same thing done at this office for $20 but was not given that option. Advised pt with the symptoms he was having he would have been directed to go to the ER as well and the risks involved with these symptoms. Advised the symptoms reported that he had and the pt confirmed he was having those symptoms. He said "I know it is not your fault, I am just upset with the clinic, because every time I call lately I am told there aren't any apts and I need to go the the UC or ER. I should be able to see my doctor." Inquired when the last time the pt called for apt and he said he has not called recently except today and he was told to go to the ER and it cost him $250. Apologized pt was not happy he was advised to go to the ER but that this office would have advised the same outcome. Pt stated "well I am just frustrated because I had to spend $250 and I understanding why they sent me, but I think I should be able to get an apt at my doctors office. Everything they did there could have been done at that office. I hope you all have a great day and a Happy Valentine's day. Advised if any other symptoms occur or any symptoms worsen to contact office. Pt verbalized understanding.

## 2020-03-06 NOTE — Telephone Encounter (Signed)
Will review ER note 

## 2020-03-06 NOTE — ED Notes (Signed)
Pt in xray

## 2020-03-06 NOTE — Discharge Instructions (Signed)
Follow discharge care instruction take medication as directed.  No acute findings x-ray of the lumbar spine.

## 2020-03-06 NOTE — ED Notes (Signed)
Pt given cup for urine sample 

## 2020-03-06 NOTE — Telephone Encounter (Signed)
[facility] Day - Client TELEPHONE ADVICE RECORD [employer] Patient Name: Anthony Myers Gender: Male DOB: 1980-11-17 Age: 40 Y 7 M 2 D Return Phone Number: [phone] (Primary) Address[place] [address] Client [facility] Day - Client Client Site [facility] - Day Physician Nicki Reaper - NP Contact Type Call Who Is Calling Patient / Member / Family / Caregiver Call Type Triage / Clinical Relationship To Patient Self Return Phone Number [phone] (Primary) Chief Complaint BREATHING - shortness of breath or sounds breathless Reason for Call Symptomatic / Request for Health Information Initial Comment Caller states he is having numbness to his legs, hands, fingers, shortness of breath. GOTO Facility Not Listed [facility], [address] [place], [place] [address] Translation No Nurse Assessment Nurse: Su Hilt, RN, Lupita Leash Date/Time Anthony Myers Time): 03/06/2020 8:17:40 AM Confirm and document reason for call. If symptomatic, describe symptoms. ---Caller states he is having increased symptoms of anxiety. When it occurs he has symptoms of shortness of breath and numbness in his body when he has symptoms. He states the numbness is most concerning to him today and denies current issues with his breathing. Does the patient have any new or worsening symptoms? ---Yes Will a triage be completed? ---Yes Related visit to physician within the last 2 weeks? ---No Does the PT have any chronic conditions? (i.e. diabetes, asthma, this includes High risk factors for pregnancy, etc.) ---No Is this a behavioral health or substance abuse call? ---No Guidelines Guideline Title Affirmed Question Affirmed Notes Nurse Date/Time Anthony Myers Time) Neurologic Deficit [1] Back pain AND [2] numbness (loss of sensation) in groin or rectal area Maylon Cos 03/06/2020 8:21:51  AM Disp. Time Anthony Myers Time) Disposition Final User 03/06/2020 8:16:16 AM Send to Urgent Queue Pearletha Furl 03/06/2020 8:28:25 AM Go to ED Now Yes Su Hilt, RN, Marolyn Hammock NOTE: All timestamps contained within this report are represented as [place] Standard Time. CONFIDENTIALTY NOTICE: This fax transmission is intended only for the addressee. It contains information that is legally privileged, confidential or otherwise protected from use or disclosure. If you are not the intended recipient, you are strictly prohibited from reviewing, disclosing, copying using or disseminating any of this information or taking any action in reliance on or regarding this information. If you have received this fax in error, please notify us immediately by telephone so that we can arrange for its return to [place]. Phone: [phone], Toll-Free: [phone], Fax: [phone] Page: 2 of 2 Call Id: [id number] Caller Disagree/Comply Comply Caller Understands Yes PreDisposition InappropriateToAsk Care Advice Given Per Guideline GO TO ED NOW: * You need to be seen in the Emergency Department. * Go to the ED at ___________ Hospital. * Leave now. Drive carefully. ANOTHER ADULT SHOULD DRIVE: * It is better and safer if another adult drives instead of you. Comments User: Rocky Link, RN Date/Time Anthony Myers Time): 03/06/2020 8:28:24 AM Caller clarified he is not having anxiety currently, but having numbness that is intermittent. Referrals GO TO FACILITY OTHER - SPECIFY

## 2020-03-18 NOTE — Telephone Encounter (Signed)
Noted, thank you for addressing with patient in my absence.

## 2020-08-25 ENCOUNTER — Encounter: Payer: Self-pay | Admitting: Internal Medicine

## 2020-09-09 ENCOUNTER — Ambulatory Visit (INDEPENDENT_AMBULATORY_CARE_PROVIDER_SITE_OTHER): Payer: BC Managed Care – PPO | Admitting: Internal Medicine

## 2020-09-09 ENCOUNTER — Encounter: Payer: Self-pay | Admitting: Internal Medicine

## 2020-09-09 ENCOUNTER — Other Ambulatory Visit: Payer: Self-pay

## 2020-09-09 VITALS — BP 119/68 | HR 68 | Temp 98.4°F | Resp 17 | Ht 68.0 in | Wt 230.2 lb

## 2020-09-09 DIAGNOSIS — I10 Essential (primary) hypertension: Secondary | ICD-10-CM

## 2020-09-09 DIAGNOSIS — G4733 Obstructive sleep apnea (adult) (pediatric): Secondary | ICD-10-CM

## 2020-09-09 DIAGNOSIS — Z0001 Encounter for general adult medical examination with abnormal findings: Secondary | ICD-10-CM

## 2020-09-09 DIAGNOSIS — Z6836 Body mass index (BMI) 36.0-36.9, adult: Secondary | ICD-10-CM | POA: Insufficient documentation

## 2020-09-09 DIAGNOSIS — E782 Mixed hyperlipidemia: Secondary | ICD-10-CM

## 2020-09-09 DIAGNOSIS — F419 Anxiety disorder, unspecified: Secondary | ICD-10-CM

## 2020-09-09 DIAGNOSIS — E119 Type 2 diabetes mellitus without complications: Secondary | ICD-10-CM

## 2020-09-09 DIAGNOSIS — E6609 Other obesity due to excess calories: Secondary | ICD-10-CM | POA: Insufficient documentation

## 2020-09-09 DIAGNOSIS — F32A Depression, unspecified: Secondary | ICD-10-CM

## 2020-09-09 DIAGNOSIS — Z1159 Encounter for screening for other viral diseases: Secondary | ICD-10-CM

## 2020-09-09 DIAGNOSIS — K219 Gastro-esophageal reflux disease without esophagitis: Secondary | ICD-10-CM

## 2020-09-09 DIAGNOSIS — Z114 Encounter for screening for human immunodeficiency virus [HIV]: Secondary | ICD-10-CM | POA: Diagnosis not present

## 2020-09-09 DIAGNOSIS — Z6835 Body mass index (BMI) 35.0-35.9, adult: Secondary | ICD-10-CM

## 2020-09-09 NOTE — Assessment & Plan Note (Signed)
Encourage continued weight loss as this can help reduce sleep apnea symptoms Continue CPAP Referral to pulmonology placed

## 2020-09-09 NOTE — Patient Instructions (Signed)
Health Maintenance, Male Adopting a healthy lifestyle and getting preventive care are important in promoting health and wellness. Ask your health care provider about: The right schedule for you to have regular tests and exams. Things you can do on your own to prevent diseases and keep yourself healthy. What should I know about diet, weight, and exercise? Eat a healthy diet  Eat a diet that includes plenty of vegetables, fruits, low-fat dairy products, and lean protein. Do not eat a lot of foods that are high in solid fats, added sugars, or sodium.  Maintain a healthy weight Body mass index (BMI) is a measurement that can be used to identify possible weight problems. It estimates body fat based on height and weight. Your health care provider can help determine your BMI and help you achieve or maintain ahealthy weight. Get regular exercise Get regular exercise. This is one of the most important things you can do for your health. Most adults should: Exercise for at least 150 minutes each week. The exercise should increase your heart rate and make you sweat (moderate-intensity exercise). Do strengthening exercises at least twice a week. This is in addition to the moderate-intensity exercise. Spend less time sitting. Even light physical activity can be beneficial. Watch cholesterol and blood lipids Have your blood tested for lipids and cholesterol at 40 years of age, then havethis test every 5 years. You may need to have your cholesterol levels checked more often if: Your lipid or cholesterol levels are high. You are older than 40 years of age. You are at high risk for heart disease. What should I know about cancer screening? Many types of cancers can be detected early and may often be prevented. Depending on your health history and family history, you may need to have cancer screening at various ages. This may include screening for: Colorectal cancer. Prostate cancer. Skin cancer. Lung  cancer. What should I know about heart disease, diabetes, and high blood pressure? Blood pressure and heart disease High blood pressure causes heart disease and increases the risk of stroke. This is more likely to develop in people who have high blood pressure readings, are of African descent, or are overweight. Talk with your health care provider about your target blood pressure readings. Have your blood pressure checked: Every 3-5 years if you are 18-39 years of age. Every year if you are 40 years old or older. If you are between the ages of 65 and 75 and are a current or former smoker, ask your health care provider if you should have a one-time screening for abdominal aortic aneurysm (AAA). Diabetes Have regular diabetes screenings. This checks your fasting blood sugar level. Have the screening done: Once every three years after age 45 if you are at a normal weight and have a low risk for diabetes. More often and at a younger age if you are overweight or have a high risk for diabetes. What should I know about preventing infection? Hepatitis B If you have a higher risk for hepatitis B, you should be screened for this virus. Talk with your health care provider to find out if you are at risk forhepatitis B infection. Hepatitis C Blood testing is recommended for: Everyone born from 1945 through 1965. Anyone with known risk factors for hepatitis C. Sexually transmitted infections (STIs) You should be screened each year for STIs, including gonorrhea and chlamydia, if: You are sexually active and are younger than 40 years of age. You are older than 40 years of age   and your health care provider tells you that you are at risk for this type of infection. Your sexual activity has changed since you were last screened, and you are at increased risk for chlamydia or gonorrhea. Ask your health care provider if you are at risk. Ask your health care provider about whether you are at high risk for HIV.  Your health care provider may recommend a prescription medicine to help prevent HIV infection. If you choose to take medicine to prevent HIV, you should first get tested for HIV. You should then be tested every 3 months for as long as you are taking the medicine. Follow these instructions at home: Lifestyle Do not use any products that contain nicotine or tobacco, such as cigarettes, e-cigarettes, and chewing tobacco. If you need help quitting, ask your health care provider. Do not use street drugs. Do not share needles. Ask your health care provider for help if you need support or information about quitting drugs. Alcohol use Do not drink alcohol if your health care provider tells you not to drink. If you drink alcohol: Limit how much you have to 0-2 drinks a day. Be aware of how much alcohol is in your drink. In the [place] one drink equals one 12 oz bottle of beer (355 mL), one 5 oz glass of wine (148 mL), or one 1 oz glass of hard liquor (44 mL). General instructions Schedule regular health, dental, and eye exams. Stay current with your vaccines. Tell your health care provider if: You often feel depressed. You have ever been abused or do not feel safe at home. Summary Adopting a healthy lifestyle and getting preventive care are important in promoting health and wellness. Follow your health care provider's instructions about healthy diet, exercising, and getting tested or screened for diseases. Follow your health care provider's instructions on monitoring your cholesterol and blood pressure. This information is not intended to replace advice given to you by your health care provider. Make sure you discuss any questions you have with your healthcare provider. Document Revised: 01/04/2018 Document Reviewed: 01/04/2018 Elsevier Patient Education  2022 [employer]  

## 2020-09-09 NOTE — Assessment & Plan Note (Signed)
A1c today Will obtain urine microalbumin at next visit Encouraged him to consume a low-carb diet and continue exercise weight loss Encourage routine eye exams Encourage routine foot exams He declines flu shot, Pneumovax and COVID-vaccine

## 2020-09-09 NOTE — Assessment & Plan Note (Signed)
Avoid foods that trigger your reflux Okay to continue medications OTC Encourage continued weight loss as this can produce reflux symptoms

## 2020-09-09 NOTE — Assessment & Plan Note (Signed)
Controlled off meds Reinforced DASH diet and continued weight loss CMET today

## 2020-09-09 NOTE — Assessment & Plan Note (Signed)
No issues off meds °

## 2020-09-09 NOTE — Assessment & Plan Note (Signed)
Encourage diet and exercise for weight loss 

## 2020-09-09 NOTE — Assessment & Plan Note (Signed)
C-Met and lipid profile today Encouraged him to consume a low-fat diet

## 2020-09-09 NOTE — Progress Notes (Signed)
Subjective:    Patient ID: Anthony Myers, male    DOB: [birth year], 39 y.o.   MRN: [id number]  HPI  Patient presents the clinic today for his annual exam.  He is also due to follow-up chronic conditions.  HTN: His BP today is 119/68.  He is no longer taking Lisinopril. ECG from 02/2015 reviewed.  HLD: His last LDL was 44, triglycerides 127, 01/2019.  He is not taking any cholesterol-lowering medication at this time.  He consumes a low-fat diet.  DM2: His last A1c was 5.7%, 01/2019.  He is not taking any oral diabetic medication at this time.  He does not check his sugars.  His last eye exam was more than 1 year ago.  Anxiety and Depression: In remission, off meds.  He is not currently seeing a therapist.  He denies SI/HI.  OSA: He averages 5 hours of sleep per night with the use of CPAP.  Sleep study from 06/2017 reviewed.  GERD: Triggered by sweets and fried foods. He takes Zegrid or Tums chews OTC with good relief of symptoms. There is no upper GI on file.  Flu: 02/2015 Tetanus: 09/2014 Pneumovax: never COVID: Moderna x 2 Vision screening: as needed Dentist: as needed  Diet: He does eat. He consumes fruits and veggies. He tries to avoid fried foods. He drinks mostly water and Dt. Mt Dew. Exercise: None  Review of Systems     Past Medical History:  Diagnosis Date   Anxiety    Depression    Insomnia     Current Outpatient Medications  Medication Sig Dispense Refill   albuterol (VENTOLIN HFA) 108 (90 Base) MCG/ACT inhaler Inhale 2 puffs into the lungs every 6 (six) hours as needed for wheezing or shortness of breath. 8 g 0   HYDROcodone-homatropine (HYCODAN) 5-1.5 MG/5ML syrup Take 5 mLs by mouth every 8 (eight) hours as needed for cough. 120 mL 0   lisinopril (ZESTRIL) 2.5 MG tablet TAKE 1 TABLET BY MOUTH EVERY DAY 90 tablet 1   naproxen (NAPROSYN) 500 MG tablet Take 1 tablet (500 mg total) by mouth 2 (two) times daily with a meal. 20 tablet 00   orphenadrine (NORFLEX) 100 MG  tablet Take 1 tablet (100 mg total) by mouth 2 (two) times daily. 10 tablet 0   predniSONE (DELTASONE) 10 MG tablet Take 3 tabs on days 1-2, take 2 tabs on days 3-4, take 1 tab on days 5-6 12 tablet 0   No current facility-administered medications for this visit.    Allergies  Allergen Reactions   Tylenol [Acetaminophen] Swelling    "feels bad"    Family History  Problem Relation Age of Onset   Mental illness Mother    Hypertension Father     Social History   Socioeconomic History   Marital status: Single    Spouse name: Not on file   Number of children: Not on file   Years of education: Not on file   Highest education level: Not on file  Occupational History   Not on file  Tobacco Use   Smoking status: Former   Smokeless tobacco: Never  Substance and Sexual Activity   Alcohol use: No    Alcohol/week: 0.0 standard drinks   Drug use: No   Sexual activity: Not on file  Other Topics Concern   Not on file  Social History Narrative   Not on file   Social Determinants of Health   Financial Resource Strain: Not on file  Food Insecurity:  Not on file  Transportation Needs: Not on file  Physical Activity: Not on file  Stress: Not on file  Social Connections: Not on file  Intimate Partner Violence: Not on file     Constitutional: Denies fever, malaise, fatigue, headache or abrupt weight changes.  HEENT: Denies eye pain, eye redness, ear pain, ringing in the ears, wax buildup, runny nose, nasal congestion, bloody nose, or sore throat. Respiratory: Denies difficulty breathing, shortness of breath, cough or sputum production.   Cardiovascular: Denies chest pain, chest tightness, palpitations or swelling in the hands or feet.  Gastrointestinal: Pt reports intermittent reflux. Denies abdominal pain, bloating, constipation, diarrhea or blood in the stool.  GU: Denies urgency, frequency, pain with urination, burning sensation, blood in urine, odor or  discharge. Musculoskeletal: Denies decrease in range of motion, difficulty with gait, muscle pain or joint pain and swelling.  Skin: Denies redness, rashes, lesions or ulcercations.  Neurological: Denies dizziness, difficulty with memory, difficulty with speech or problems with balance and coordination.  Psych: Denies anxiety, depression, SI/HI.  No other specific complaints in a complete review of systems (except as listed in HPI above).  Objective:   Physical Exam  BP 119/68 (BP Location: Right Arm, Patient Position: Sitting, Cuff Size: Large)   Pulse 68   Temp 98.4 F (36.9 C) (Temporal)   Resp 17   Ht 5' 8"  (1.727 m)   Wt 230 lb 3.2 oz (104.4 kg)   SpO2 100%   BMI 35.00 kg/m   Wt Readings from Last 3 Encounters:  03/06/20 245 lb (111.1 kg)  02/12/19 275 lb (124.7 kg)  09/15/18 292 lb (132.5 kg)    General: Appears his stated age, obese, in NAD. Skin: Warm, dry and intact. No rashes noted. HEENT: Head: normal shape and size; Eyes: sclera white and EOMs intact;  Neck:  Neck supple, trachea midline. No masses, lumps or thyromegaly present.  Cardiovascular: Normal rate and rhythm. S1,S2 noted.  No murmur, rubs or gallops noted. No JVD or BLE edema.  Pulmonary/Chest: Normal effort and positive vesicular breath sounds. No respiratory distress. No wheezes, rales or ronchi noted.  Abdomen: Soft and nontender. Normal bowel sounds. No distention or masses noted. Liver, spleen and kidneys non palpable. Musculoskeletal: Strength 5/5 BUE/BLE. No difficulty with gait.  Neurological: Alert and oriented. Cranial nerves II-XII grossly intact. Coordination normal.  Psychiatric: Mood and affect normal. Behavior is normal. Judgment and thought content normal.     BMET    Component Value Date/Time   NA 139 02/12/2019 1546   NA 142 01/14/2013 1416   K 4.4 02/12/2019 1546   K 4.1 01/14/2013 1416   CL 103 02/12/2019 1546   CL 109 (H) 01/14/2013 1416   CO2 27 02/12/2019 1546   CO2 27  01/14/2013 1416   GLUCOSE 79 02/12/2019 1546   GLUCOSE 113 (H) 01/14/2013 1416   BUN 12 02/12/2019 1546   BUN 11 01/14/2013 1416   CREATININE 0.76 02/12/2019 1546   CREATININE 0.76 09/15/2018 1608   CALCIUM 9.8 02/12/2019 1546   CALCIUM 9.3 01/14/2013 1416   GFRNONAA >60 01/14/2013 1416   GFRAA >60 01/14/2013 1416    Lipid Panel     Component Value Date/Time   CHOL 108 02/12/2019 1546   TRIG 127.0 02/12/2019 1546   HDL 37.80 (L) 02/12/2019 1546   CHOLHDL 3 02/12/2019 1546   VLDL 25.4 02/12/2019 1546   LDLCALC 44 02/12/2019 1546   LDLCALC 50 09/15/2018 1608    CBC  Component Value Date/Time   WBC 12.8 (H) 02/12/2019 1546   RBC 5.56 02/12/2019 1546   HGB 15.8 02/12/2019 1546   HGB 15.5 01/14/2013 1416   HCT 47.5 02/12/2019 1546   HCT 48.2 01/14/2013 1416   PLT 222.0 02/12/2019 1546   PLT 205 01/14/2013 1416   MCV 85.4 02/12/2019 1546   MCV 85 01/14/2013 1416   MCH 29.7 01/11/2014 1540   MCHC 33.3 02/12/2019 1546   RDW 13.9 02/12/2019 1546   RDW 14.0 01/14/2013 1416   LYMPHSABS 2.6 01/11/2014 1540   MONOABS 0.9 01/11/2014 1540   EOSABS 0.2 01/11/2014 1540   BASOSABS 0.1 01/11/2014 1540    Hgb A1C Lab Results  Component Value Date   HGBA1C 5.7 02/12/2019           Assessment & Plan:   Preventative Health Maintenance:  Encouraged him to get a flu shot in the fall Tetanus UTD He declines Pneumovax at this time Encouraged him to get a COVID-booster Encouraged him to consume a balanced diet and exercise regimen Advised him to see an eye doctor and dentist annually We will check CBC, c-Met, lipid, A1c, HIV and hep C today  RTC in 3 months, follow-up chronic conditions Webb Silversmith, NP This visit occurred during the SARS-CoV-2 public health emergency.  Safety protocols were in place, including screening questions prior to the visit, additional usage of staff PPE, and extensive cleaning of exam room while observing appropriate contact time as indicated  for disinfecting solutions.

## 2020-09-10 LAB — CBC
HCT: 48.8 % (ref 38.5–50.0)
Hemoglobin: 16.8 g/dL (ref 13.2–17.1)
MCH: 29.5 pg (ref 27.0–33.0)
MCHC: 34.4 g/dL (ref 32.0–36.0)
MCV: 85.8 fL (ref 80.0–100.0)
MPV: 10.9 fL (ref 7.5–12.5)
Platelets: 188 10*3/uL (ref 140–400)
RBC: 5.69 10*6/uL (ref 4.20–5.80)
RDW: 12.9 % (ref 11.0–15.0)
WBC: 6.3 10*3/uL (ref 3.8–10.8)

## 2020-09-10 LAB — COMPLETE METABOLIC PANEL WITH GFR
AG Ratio: 2 (calc) (ref 1.0–2.5)
ALT: 23 U/L (ref 9–46)
AST: 16 U/L (ref 10–40)
Albumin: 4.8 g/dL (ref 3.6–5.1)
Alkaline phosphatase (APISO): 65 U/L (ref 36–130)
BUN: 10 mg/dL (ref 7–25)
CO2: 26 mmol/L (ref 20–32)
Calcium: 9.5 mg/dL (ref 8.6–10.3)
Chloride: 105 mmol/L (ref 98–110)
Creat: 0.61 mg/dL (ref 0.60–1.29)
Globulin: 2.4 g/dL (calc) (ref 1.9–3.7)
Glucose, Bld: 87 mg/dL (ref 65–99)
Potassium: 4.3 mmol/L (ref 3.5–5.3)
Sodium: 140 mmol/L (ref 135–146)
Total Bilirubin: 0.6 mg/dL (ref 0.2–1.2)
Total Protein: 7.2 g/dL (ref 6.1–8.1)
eGFR: 125 mL/min/{1.73_m2} (ref >=60)

## 2020-09-10 LAB — HEPATITIS C ANTIBODY
Hepatitis C Ab: NONREACTIVE
SIGNAL TO CUT-OFF: 0.04 (ref <1.00)

## 2020-09-10 LAB — LIPID PANEL
Cholesterol: 112 mg/dL (ref <200)
HDL: 41 mg/dL (ref >=40)
LDL Cholesterol (Calc): 54 mg/dL (calc)
Non-HDL Cholesterol (Calc): 71 mg/dL (calc) (ref <130)
Total CHOL/HDL Ratio: 2.7 (calc) (ref <5.0)
Triglycerides: 83 mg/dL (ref <150)

## 2020-09-10 LAB — HEMOGLOBIN A1C
Hgb A1c MFr Bld: 5 % of total Hgb (ref <5.7)
Mean Plasma Glucose: 97 mg/dL
eAG (mmol/L): 5.4 mmol/L

## 2020-09-10 LAB — HIV ANTIBODY (ROUTINE TESTING W REFLEX): HIV 1&2 Ab, 4th Generation: NONREACTIVE

## 2020-09-10 LAB — TSH: TSH: 2.8 mIU/L (ref 0.40–4.50)

## 2020-09-26 ENCOUNTER — Other Ambulatory Visit: Payer: Self-pay | Admitting: Internal Medicine

## 2020-12-25 ENCOUNTER — Encounter: Payer: Self-pay | Admitting: Adult Health

## 2020-12-25 ENCOUNTER — Ambulatory Visit (INDEPENDENT_AMBULATORY_CARE_PROVIDER_SITE_OTHER): Payer: BC Managed Care – PPO | Admitting: Adult Health

## 2020-12-25 ENCOUNTER — Other Ambulatory Visit: Payer: Self-pay

## 2020-12-25 VITALS — BP 126/80 | HR 76 | Temp 97.8°F | Ht 68.0 in | Wt 236.4 lb

## 2020-12-25 DIAGNOSIS — G4733 Obstructive sleep apnea (adult) (pediatric): Secondary | ICD-10-CM

## 2020-12-25 DIAGNOSIS — Z6835 Body mass index (BMI) 35.0-35.9, adult: Secondary | ICD-10-CM | POA: Diagnosis not present

## 2020-12-25 NOTE — Patient Instructions (Addendum)
Set up for home sleep study.  May try Dream wear nasal.  Wear CPAP At bedtime  each night for at least 4-6 hr  Work on healthy weight loss- great job Follow up with Dr. Belia Heman in 1 year and As needed

## 2020-12-25 NOTE — Assessment & Plan Note (Signed)
Healthy weight loss discussed 

## 2020-12-25 NOTE — Assessment & Plan Note (Signed)
Severe obstructive sleep apnea.  Patient has done very well with weight loss.  We will recheck a home sleep study to see if he still has sleep apnea.  For now would change to a DreamWear nasal mask to see if this is more comfortable for patient.  We will continue on current settings as he is well controlled on current AutoSet. He is encouraged on CPAP compliance and care.  Plan  Patient Instructions  Set up for home sleep study.  May try Dream wear nasal.  Wear CPAP At bedtime  each night for at least 4-6 hr  Work on healthy weight loss- great job Follow up with Dr. Belia Heman in 1 year and As needed

## 2020-12-25 NOTE — Progress Notes (Signed)
@Patient  ID: , male    DOB: 03-31-1980, 40 y.o.   MRN: [id number]  Chief Complaint  Patient presents with   Follow-up    Referring provider: [id number], NP  HPI: 40 year old male followed for severe obstructive sleep apnea   TEST/EVENTS :  **CPAP titration 06/27/2017>> recommended auto CPAP with pressure range 14-20. **Home sleep test 05/30/2017>> severe obstructive sleep apnea with AHI 70.  12/25/2020 Follow up : Obstructive sleep apnea Patient returns for a follow-up visit for sleep apnea.  Patient was last seen in the office in August 2020.  Patient has known severe obstructive sleep apnea.  Patient says he wears his CPAP each night.  However he feels that his CPAP is uncomfortable.  The mask he is wearing now  feels very tight at times and leaves indentions on his head at times.  Patient has also been working on healthy weight loss.  He has  lost 140 pounds over the last 2 years.  He says he has been eating healthy and being active.  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  He recently had his DOT physical. Patient would like to be rechecked for sleep apnea see if he still has it since he has lost so much weight.  Recent CPAP download shows 97% compliance with 77% compliance with greater than 4 hours.  Daily average usage at 5.5 hours.  Patient is on auto CPAP 14 to 20 cm H2O.  AHI 1.1/hour.  Patient says he feels well with no significant daytime sleepiness.  Feels that he benefits from CPAP.  Allergies  Allergen Reactions   Tylenol [Acetaminophen] Swelling    "feels bad"    Immunization History  Administered Date(s) Administered   Influenza,inj,Quad PF,6+ Mos 03/07/2015   Tdap 09/26/2014    Past Medical History:  Diagnosis Date   Anxiety    Depression    Insomnia     Tobacco History: Social History   Tobacco Use  Smoking Status Every Day   Packs/day: 1.00   Types: Cigarettes  Smokeless Tobacco Never   Ready to quit: Not  Answered Counseling given: Not Answered   Outpatient Medications Prior to Visit  Medication Sig Dispense Refill   albuterol (VENTOLIN HFA) 108 (90 Base) MCG/ACT inhaler Inhale 2 puffs into the lungs every 6 (six) hours as needed for wheezing or shortness of breath. 8 g 0   No facility-administered medications prior to visit.     Review of Systems:   Constitutional:   No  weight loss, night sweats,  Fevers, chills, fatigue, or  lassitude.  HEENT:   No headaches,  Difficulty swallowing,  Tooth/dental problems, or  Sore throat,                No sneezing, itching, ear ache, nasal congestion, post nasal drip,   CV:  No chest pain,  Orthopnea, PND, swelling in lower extremities, anasarca, dizziness, palpitations, syncope.   GI  No heartburn, indigestion, abdominal pain, nausea, vomiting, diarrhea, change in bowel habits, loss of appetite, bloody stools.   Resp: No shortness of breath with exertion or at rest.  No excess mucus, no productive cough,  No non-productive cough,  No coughing up of blood.  No change in color of mucus.  No wheezing.  No chest wall deformity  Skin: no rash or lesions.  GU: no dysuria, change in color of urine, no urgency or frequency.  No flank pain, no hematuria   MS:  No joint pain  or swelling.  No decreased range of motion.  No back pain.    Physical Exam  BP 126/80 (BP Location: Left Arm, Patient Position: Sitting, Cuff Size: Large)   Pulse 76   Temp 97.8 F (36.6 C) (Oral)   Ht 5\' 8"  (1.727 m)   Wt 236 lb 6.4 oz (107.2 kg)   SpO2 98%   BMI 35.94 kg/m   GEN: A/Ox3; pleasant , NAD, well nourished    HEENT:  [place]/AT,   NOSE-clear, THROAT-clear, no lesions, no postnasal drip or exudate noted.  Class III MP airway  NECK:  Supple w/ fair ROM; no JVD; normal carotid impulses w/o bruits; no thyromegaly or nodules palpated; no lymphadenopathy.    RESP  Clear  P & A; w/o, wheezes/ rales/ or rhonchi. no accessory muscle use, no dullness to  percussion  CARD:  RRR, no m/r/g, no peripheral edema, pulses intact, no cyanosis or clubbing.  GI:   Soft & nt; nml bowel sounds; no organomegaly or masses detected.   Musco: Warm bil, no deformities or joint swelling noted.   Neuro: alert, no focal deficits noted.    Skin: Warm, no lesions or rashes    Lab Results:      BNP No results found for: BNP  ProBNP No results found for: PROBNP  Imaging: No results found.    No flowsheet data found.  No results found for: NITRICOXIDE      Assessment & Plan:   OSA (obstructive sleep apnea) Severe obstructive sleep apnea.  Patient has done very well with weight loss.  We will recheck a home sleep study to see if he still has sleep apnea.  For now would change to a DreamWear nasal mask to see if this is more comfortable for patient.  We will continue on current settings as he is well controlled on current AutoSet. He is encouraged on CPAP compliance and care.  Plan  Patient Instructions  Set up for home sleep study.  May try Dream wear nasal.  Wear CPAP At bedtime  each night for at least 4-6 hr  Work on healthy weight loss- great job Follow up with Dr. Mortimer Fries in 1 year and As needed         Class 2 severe obesity due to excess calories with serious comorbidity and body mass index (BMI) of 35.0 to 35.9 in adult [facility]) Healthy weight loss discussed      Rexene Edison, NP 12/25/2020

## 2021-01-14 ENCOUNTER — Ambulatory Visit: Payer: BC Managed Care – PPO | Attending: Otolaryngology

## 2021-01-14 DIAGNOSIS — G4733 Obstructive sleep apnea (adult) (pediatric): Secondary | ICD-10-CM | POA: Insufficient documentation

## 2021-01-14 DIAGNOSIS — G4700 Insomnia, unspecified: Secondary | ICD-10-CM | POA: Insufficient documentation

## 2021-01-14 DIAGNOSIS — Z6835 Body mass index (BMI) 35.0-35.9, adult: Secondary | ICD-10-CM | POA: Insufficient documentation

## 2021-02-02 ENCOUNTER — Other Ambulatory Visit: Payer: Self-pay

## 2021-02-02 ENCOUNTER — Encounter: Payer: Self-pay | Admitting: Adult Health

## 2021-02-02 ENCOUNTER — Telehealth (INDEPENDENT_AMBULATORY_CARE_PROVIDER_SITE_OTHER): Payer: BC Managed Care – PPO | Admitting: Adult Health

## 2021-02-02 ENCOUNTER — Telehealth: Payer: Self-pay | Admitting: Adult Health

## 2021-02-02 DIAGNOSIS — G4733 Obstructive sleep apnea (adult) (pediatric): Secondary | ICD-10-CM

## 2021-02-02 DIAGNOSIS — Z6835 Body mass index (BMI) 35.0-35.9, adult: Secondary | ICD-10-CM | POA: Diagnosis not present

## 2021-02-02 NOTE — Telephone Encounter (Signed)
Home sleep study done at [facility]. Completed on January 14, 2021  Showed severe obstructive sleep apnea with AHI at 31.3/hour with SPO2 low at 82%.  Please set up an office visit or video visit to discuss results and treatment plan.

## 2021-02-02 NOTE — Patient Instructions (Signed)
Order for Dream wear nasal.  Wear CPAP At bedtime  each night for at least 6 hr  Work on healthy weight loss- great job Keep working on quitting smoking  Follow up with Dr. Belia Heman in 1 year and As needed

## 2021-02-02 NOTE — Telephone Encounter (Signed)
Per Tp verbally--contact [employer] to check order status of dream ware mask. Order was placed 12/25/2020.  Lm for Brad with [employer].

## 2021-02-02 NOTE — Telephone Encounter (Signed)
Spoke to Anthony Myers with [employer].  Leroy Sea stated that he would message the snap team for an update. He will call back with update.

## 2021-02-02 NOTE — Telephone Encounter (Signed)
Noted.  Will close encounter.  

## 2021-02-02 NOTE — Progress Notes (Signed)
Virtual Visit via Video Note  I connected with Anthony Myers on 02/02/21 at  2:30 PM EST by a video enabled telemedicine application and verified that I am speaking with the correct person using two identifiers.  Location: Patient: Home  Provider: Office    I discussed the limitations of evaluation and management by telemedicine and the availability of in person appointments. The patient expressed understanding and agreed to proceed.  History of Present Illness: 41 year old male followed for severe obstructive sleep apnea  Patient returns for a 1 month follow-up.  Patient has underlying severe obstructive sleep apnea.  Patient was seen last visit to see if he still had sleep apnea.  He has lost 140 pounds over the last 2 years.  Patient is very active and is eating healthy.  He is also a truck driver and gets DOT physicals.  Patient does wear his CPAP most every night.  But would like to come off CPAP if he was able to.  Patient was set up for a repeat sleep study .  Home sleep study done on January 14, 2021 showed persistent severe obstructive sleep apnea with AHI at 31.3/hour and SPO2 low at 82%.  We discussed his sleep study results.  And recommendations to continue on CPAP at bedtime and naps. CPAP download shows 80% compliance with daily average usage at 5 hours.  Patient is on auto CPAP 14 to 20 cm H2O.  AHI 1.3/hour.  Is working on not smoking has cut back to 1-2 cigs daily , using nicorette gum. Discussed cessation.      Observations/Objective: Showed severe obstructive sleep apnea with AHI at 31.3/hour with SPO2 low at 82%.  **CPAP titration 06/27/2017>> recommended auto CPAP with pressure range 14-20. **Home sleep test 05/30/2017>> severe obstructive sleep apnea with AHI 70.         Assessment and Plan: Severe obstructive sleep apnea.  Patient has done exceptionally well with his weight loss.  He has decreased his severity of obstructive sleep apnea down from 70 events to 30  events/hr  however he continues to have severe OSA .  Recommend to continue on nocturnal CPAP  Morbid obesity encouraged on healthy weight loss.  Patient has done a great job with his weight loss program.  Tobacco abuse-patient is doing good cutting back on his smoking.  Smoking cessation discussed.  Plan  Patient Instructions  Order for Dream wear nasal.  Wear CPAP At bedtime  each night for at least 6 hr  Work on healthy weight loss- great job Keep working on quitting smoking  Follow up with Dr. Belia Heman in 1 year and As needed        Follow Up Instructions:    I discussed the assessment and treatment plan with the patient. The patient was provided an opportunity to ask questions and all were answered. The patient agreed with the plan and demonstrated an understanding of the instructions.   The patient was advised to call back or seek an in-person evaluation if the symptoms worsen or if the condition fails to improve as anticipated.  I provided 20 minutes of non-face-to-face time during this encounter.   Rubye Oaks, NP

## 2021-02-03 NOTE — Telephone Encounter (Signed)
Per Sparks with [employer], he received a email back from the snap team and mask will be shipped to patient.  Patient is aware and voiced his understanding.  Nothing further needed at this time.

## 2021-02-10 ENCOUNTER — Other Ambulatory Visit: Payer: Self-pay

## 2021-10-12 ENCOUNTER — Encounter: Payer: Self-pay | Admitting: Internal Medicine

## 2021-10-12 ENCOUNTER — Ambulatory Visit: Payer: BC Managed Care – PPO | Admitting: Internal Medicine

## 2021-10-12 VITALS — BP 136/88 | HR 64 | Temp 97.5°F | Ht 68.0 in | Wt 224.0 lb

## 2021-10-12 DIAGNOSIS — Z6834 Body mass index (BMI) 34.0-34.9, adult: Secondary | ICD-10-CM

## 2021-10-12 DIAGNOSIS — E6609 Other obesity due to excess calories: Secondary | ICD-10-CM | POA: Diagnosis not present

## 2021-10-12 DIAGNOSIS — Z0001 Encounter for general adult medical examination with abnormal findings: Secondary | ICD-10-CM | POA: Diagnosis not present

## 2021-10-12 DIAGNOSIS — E119 Type 2 diabetes mellitus without complications: Secondary | ICD-10-CM

## 2021-10-12 NOTE — Progress Notes (Signed)
Subjective:    Patient ID: Anthony Myers, male    DOB: 11-01-1980, 41 y.o.   MRN: [id number]  HPI  Patient presents to clinic today for his annual exam.  Flu: 02/2015 Tetanus: 09/2014 COVID: Never Pneumovax: Never Vision screening: as needed Dentist: as needed  Diet: He does eat meat. He consumes fruits and veggies. He does eat some fried foods. He drinks mostly Dt. Mt Dew, water or Powerade Zero Exercise: Gym, weights   Review of Systems     Past Medical History:  Diagnosis Date   Anxiety    Depression    Insomnia     Current Outpatient Medications  Medication Sig Dispense Refill   albuterol (VENTOLIN HFA) 108 (90 Base) MCG/ACT inhaler Inhale 2 puffs into the lungs every 6 (six) hours as needed for wheezing or shortness of breath. 8 g 0   No current facility-administered medications for this visit.    Allergies  Allergen Reactions   Tylenol [Acetaminophen] Swelling    "feels bad"    Family History  Problem Relation Age of Onset   Mental illness Mother    Hypertension Father     Social History   Socioeconomic History   Marital status: Single    Spouse name: Not on file   Number of children: Not on file   Years of education: Not on file   Highest education level: Not on file  Occupational History   Not on file  Tobacco Use   Smoking status: Every Day    Packs/day: 1.00    Types: Cigarettes   Smokeless tobacco: Never   Tobacco comments:    0.5 ppd -02/02/2021   Vaping Use   Vaping Use: Never used  Substance and Sexual Activity   Alcohol use: No    Alcohol/week: 0.0 standard drinks of alcohol   Drug use: No   Sexual activity: Yes  Other Topics Concern   Not on file  Social History Narrative   Not on file   Social Determinants of Health   Financial Resource Strain: Not on file  Food Insecurity: Not on file  Transportation Needs: Not on file  Physical Activity: Not on file  Stress: Not on file  Social Connections: Not on file  Intimate  Partner Violence: Not on file     Constitutional: Denies fever, malaise, fatigue, headache or abrupt weight changes.  HEENT: Denies eye pain, eye redness, ear pain, ringing in the ears, wax buildup, runny nose, nasal congestion, bloody nose, or sore throat. Respiratory: Denies difficulty breathing, shortness of breath, cough or sputum production.   Cardiovascular: Denies chest pain, chest tightness, palpitations or swelling in the hands or feet.  Gastrointestinal: Denies abdominal pain, bloating, constipation, diarrhea or blood in the stool.  GU: Denies urgency, frequency, pain with urination, burning sensation, blood in urine, odor or discharge. Musculoskeletal: Pt reports left side chest wall pain. Denies decrease in range of motion, difficulty with gait, or joint pain and swelling.  Skin: Denies redness, rashes, lesions or ulcercations.  Neurological: Denies dizziness, difficulty with memory, difficulty with speech or problems with balance and coordination.  Psych: Patient has a history of anxiety and depression.  Denies SI/HI.  No other specific complaints in a complete review of systems (except as listed in HPI above).  Objective:   Physical Exam  BP 136/88 (BP Location: Left Arm, Patient Position: Sitting, Cuff Size: Normal)   Pulse 64   Temp (!) 97.5 F (36.4 C) (Temporal)   Ht _0  (1.727  m)   Wt 224 lb (101.6 kg)   SpO2 99%   BMI 34.06 kg/m   Wt Readings from Last 3 Encounters:  12/25/20 236 lb 6.4 oz (107.2 kg)  09/09/20 230 lb 3.2 oz (104.4 kg)  03/06/20 245 lb (111.1 kg)    General: Appears his stated age, obese, in NAD. Skin: Warm, dry and intact. No ulcerations noted. HEENT: Head: normal shape and size; Eyes: sclera white, no icterus, conjunctiva pink, PERRLA and EOMs intact;  Neck:  Neck supple, trachea midline. No masses, lumps or thyromegaly present.  Cardiovascular: Normal rate and rhythm. S1,S2 noted.  No murmur, rubs or gallops noted. No JVD or BLE edema.   Pulmonary/Chest: Normal effort and positive vesicular breath sounds. No respiratory distress. No wheezes, rales or ronchi noted.  Abdomen: Normal bowel sounds.  Musculoskeletal: Strength 5/5 BUE/BLE. No difficulty with gait.  Neurological: Alert and oriented. Cranial nerves II-XII grossly intact. Coordination normal.  Psychiatric: Mood and affect normal. Behavior is normal. Judgment and thought content normal.    BMET    Component Value Date/Time   NA 140 09/09/2020 1347   NA 142 01/14/2013 1416   K 4.3 09/09/2020 1347   K 4.1 01/14/2013 1416   CL 105 09/09/2020 1347   CL 109 (H) 01/14/2013 1416   CO2 26 09/09/2020 1347   CO2 27 01/14/2013 1416   GLUCOSE 87 09/09/2020 1347   GLUCOSE 113 (H) 01/14/2013 1416   BUN 10 09/09/2020 1347   BUN 11 01/14/2013 1416   CREATININE 0.61 09/09/2020 1347   CALCIUM 9.5 09/09/2020 1347   CALCIUM 9.3 01/14/2013 1416   GFRNONAA >60 01/14/2013 1416   GFRAA >60 01/14/2013 1416    Lipid Panel     Component Value Date/Time   CHOL 112 09/09/2020 1347   TRIG 83 09/09/2020 1347   HDL 41 09/09/2020 1347   CHOLHDL 2.7 09/09/2020 1347   VLDL 25.4 02/12/2019 1546   LDLCALC 54 09/09/2020 1347    CBC    Component Value Date/Time   WBC 6.3 09/09/2020 1347   RBC 5.69 09/09/2020 1347   HGB 16.8 09/09/2020 1347   HGB 15.5 01/14/2013 1416   HCT 48.8 09/09/2020 1347   HCT 48.2 01/14/2013 1416   PLT 188 09/09/2020 1347   PLT 205 01/14/2013 1416   MCV 85.8 09/09/2020 1347   MCV 85 01/14/2013 1416   MCH 29.5 09/09/2020 1347   MCHC 34.4 09/09/2020 1347   RDW 12.9 09/09/2020 1347   RDW 14.0 01/14/2013 1416   LYMPHSABS 2.6 01/11/2014 1540   MONOABS 0.9 01/11/2014 1540   EOSABS 0.2 01/11/2014 1540   BASOSABS 0.1 01/11/2014 1540    Hgb A1C Lab Results  Component Value Date   HGBA1C 5.0 09/09/2020           Assessment & Plan:   Preventative Health Maintenance:  He declines flu shot today Tetanus UTD Encouraged him to get a  COVID-vaccine Encouraged him to consume a balanced diet and exercise regimen Advised him to see an eye doctor and dentist annually We will check CBC, c-Met, lipid, A1c, urine microalbumin today  RTC in 6 months, follow-up chronic conditions Webb Silversmith, NP

## 2021-10-12 NOTE — Assessment & Plan Note (Signed)
Encouraged diet and exercise for weight loss ?

## 2021-10-12 NOTE — Patient Instructions (Signed)
Health Maintenance, Male Adopting a healthy lifestyle and getting preventive care are important in promoting health and wellness. Ask your health care provider about: The right schedule for you to have regular tests and exams. Things you can do on your own to prevent diseases and keep yourself healthy. What should I know about diet, weight, and exercise? Eat a healthy diet  Eat a diet that includes plenty of vegetables, fruits, low-fat dairy products, and lean protein. Do not eat a lot of foods that are high in solid fats, added sugars, or sodium. Maintain a healthy weight Body mass index (BMI) is a measurement that can be used to identify possible weight problems. It estimates body fat based on height and weight. Your health care provider can help determine your BMI and help you achieve or maintain a healthy weight. Get regular exercise Get regular exercise. This is one of the most important things you can do for your health. Most adults should: Exercise for at least 150 minutes each week. The exercise should increase your heart rate and make you sweat (moderate-intensity exercise). Do strengthening exercises at least twice a week. This is in addition to the moderate-intensity exercise. Spend less time sitting. Even light physical activity can be beneficial. Watch cholesterol and blood lipids Have your blood tested for lipids and cholesterol at 41 years of age, then have this test every 5 years. You may need to have your cholesterol levels checked more often if: Your lipid or cholesterol levels are high. You are older than 40 years of age. You are at high risk for heart disease. What should I know about cancer screening? Many types of cancers can be detected early and may often be prevented. Depending on your health history and family history, you may need to have cancer screening at various ages. This may include screening for: Colorectal cancer. Prostate cancer. Skin cancer. Lung  cancer. What should I know about heart disease, diabetes, and high blood pressure? Blood pressure and heart disease High blood pressure causes heart disease and increases the risk of stroke. This is more likely to develop in people who have high blood pressure readings or are overweight. Talk with your health care provider about your target blood pressure readings. Have your blood pressure checked: Every 3-5 years if you are 18-39 years of age. Every year if you are 40 years old or older. If you are between the ages of 65 and 75 and are a current or former smoker, ask your health care provider if you should have a one-time screening for abdominal aortic aneurysm (AAA). Diabetes Have regular diabetes screenings. This checks your fasting blood sugar level. Have the screening done: Once every three years after age 45 if you are at a normal weight and have a low risk for diabetes. More often and at a younger age if you are overweight or have a high risk for diabetes. What should I know about preventing infection? Hepatitis B If you have a higher risk for hepatitis B, you should be screened for this virus. Talk with your health care provider to find out if you are at risk for hepatitis B infection. Hepatitis C Blood testing is recommended for: Everyone born from 1945 through 1965. Anyone with known risk factors for hepatitis C. Sexually transmitted infections (STIs) You should be screened each year for STIs, including gonorrhea and chlamydia, if: You are sexually active and are younger than 41 years of age. You are older than 41 years of age and your   health care provider tells you that you are at risk for this type of infection. Your sexual activity has changed since you were last screened, and you are at increased risk for chlamydia or gonorrhea. Ask your health care provider if you are at risk. Ask your health care provider about whether you are at high risk for HIV. Your health care provider  may recommend a prescription medicine to help prevent HIV infection. If you choose to take medicine to prevent HIV, you should first get tested for HIV. You should then be tested every 3 months for as long as you are taking the medicine. Follow these instructions at home: Alcohol use Do not drink alcohol if your health care provider tells you not to drink. If you drink alcohol: Limit how much you have to 0-2 drinks a day. Know how much alcohol is in your drink. In the [place] one drink equals one 12 oz bottle of beer (355 mL), one 5 oz glass of wine (148 mL), or one 1 oz glass of hard liquor (44 mL). Lifestyle Do not use any products that contain nicotine or tobacco. These products include cigarettes, chewing tobacco, and vaping devices, such as e-cigarettes. If you need help quitting, ask your health care provider. Do not use street drugs. Do not share needles. Ask your health care provider for help if you need support or information about quitting drugs. General instructions Schedule regular health, dental, and eye exams. Stay current with your vaccines. Tell your health care provider if: You often feel depressed. You have ever been abused or do not feel safe at home. Summary Adopting a healthy lifestyle and getting preventive care are important in promoting health and wellness. Follow your health care provider's instructions about healthy diet, exercising, and getting tested or screened for diseases. Follow your health care provider's instructions on monitoring your cholesterol and blood pressure. This information is not intended to replace advice given to you by your health care provider. Make sure you discuss any questions you have with your health care provider. Document Revised: 06/02/2020 Document Reviewed: 06/02/2020 Elsevier Patient Education  2023 Elsevier Inc.  

## 2021-10-13 LAB — COMPLETE METABOLIC PANEL WITH GFR
AG Ratio: 2 (calc) (ref 1.0–2.5)
ALT: 27 U/L (ref 9–46)
AST: 22 U/L (ref 10–40)
Albumin: 4.9 g/dL (ref 3.6–5.1)
Alkaline phosphatase (APISO): 65 U/L (ref 36–130)
BUN: 12 mg/dL (ref 7–25)
CO2: 26 mmol/L (ref 20–32)
Calcium: 9.5 mg/dL (ref 8.6–10.3)
Chloride: 103 mmol/L (ref 98–110)
Creat: 0.71 mg/dL (ref 0.60–1.29)
Globulin: 2.4 g/dL (calc) (ref 1.9–3.7)
Glucose, Bld: 82 mg/dL (ref 65–139)
Potassium: 3.8 mmol/L (ref 3.5–5.3)
Sodium: 140 mmol/L (ref 135–146)
Total Bilirubin: 0.4 mg/dL (ref 0.2–1.2)
Total Protein: 7.3 g/dL (ref 6.1–8.1)
eGFR: 118 mL/min/{1.73_m2} (ref >=60)

## 2021-10-13 LAB — CBC
HCT: 42.4 % (ref 38.5–50.0)
Hemoglobin: 14.4 g/dL (ref 13.2–17.1)
MCH: 29.4 pg (ref 27.0–33.0)
MCHC: 34 g/dL (ref 32.0–36.0)
MCV: 86.7 fL (ref 80.0–100.0)
MPV: 9.9 fL (ref 7.5–12.5)
Platelets: 190 10*3/uL (ref 140–400)
RBC: 4.89 10*6/uL (ref 4.20–5.80)
RDW: 12.5 % (ref 11.0–15.0)
WBC: 6.5 10*3/uL (ref 3.8–10.8)

## 2021-10-13 LAB — MICROALBUMIN / CREATININE URINE RATIO
Creatinine, Urine: 92 mg/dL (ref 20–320)
Microalb Creat Ratio: 54 mcg/mg creat — ABNORMAL HIGH (ref <30)
Microalb, Ur: 5 mg/dL

## 2021-10-13 LAB — HEMOGLOBIN A1C
Hgb A1c MFr Bld: 5 % of total Hgb (ref <5.7)
Mean Plasma Glucose: 97 mg/dL
eAG (mmol/L): 5.4 mmol/L

## 2021-10-13 LAB — LIPID PANEL
Cholesterol: 119 mg/dL (ref <200)
HDL: 48 mg/dL (ref >=40)
LDL Cholesterol (Calc): 54 mg/dL (calc)
Non-HDL Cholesterol (Calc): 71 mg/dL (calc) (ref <130)
Total CHOL/HDL Ratio: 2.5 (calc) (ref <5.0)
Triglycerides: 87 mg/dL (ref <150)

## 2022-01-12 ENCOUNTER — Encounter: Payer: Self-pay | Admitting: Internal Medicine

## 2022-01-12 ENCOUNTER — Ambulatory Visit: Payer: BC Managed Care – PPO | Admitting: Internal Medicine

## 2022-01-12 VITALS — BP 128/82 | HR 75 | Temp 96.9°F | Wt 222.0 lb

## 2022-01-12 DIAGNOSIS — E119 Type 2 diabetes mellitus without complications: Secondary | ICD-10-CM | POA: Diagnosis not present

## 2022-01-12 DIAGNOSIS — R42 Dizziness and giddiness: Secondary | ICD-10-CM | POA: Diagnosis not present

## 2022-01-12 DIAGNOSIS — H6992 Unspecified Eustachian tube disorder, left ear: Secondary | ICD-10-CM

## 2022-01-12 DIAGNOSIS — H5500 Unspecified nystagmus: Secondary | ICD-10-CM

## 2022-01-12 MED ORDER — PREDNISONE 10 MG PO TABS: ORAL_TABLET | ORAL | 0 refills | Status: DC

## 2022-01-12 MED ORDER — BLOOD GLUCOSE METER KIT: PACK | 0 refills | Status: DC

## 2022-01-12 NOTE — Progress Notes (Unsigned)
Subjective:    Patient ID: Anthony Myers, male    DOB: 16-Mar-1980, 41 y.o.   MRN: [id number]  HPI  Patient presents to clinic today with complaint of dizziness.  This started three weeks ago (12/22/2021).  He describes the dizziness as spinning for a short period of time but then more off balance. He reports his eyes were twitching. It seemed worse with position changes or head movement. He reports associated nausea, yellow nasal discharge, ear fullness and popping.  He denies vision changes, near syncope, chest pain, shortness of breath. He has a history of DM 2 but does not feel like his sugar was low. He has not recently started any new medications. He is not drinking enough water.  Review of Systems  Past Medical History:  Diagnosis Date   Anxiety    Depression    Insomnia     No current outpatient medications on file.   No current facility-administered medications for this visit.    Allergies  Allergen Reactions   Tylenol [Acetaminophen] Swelling    "feels bad"    Family History  Problem Relation Age of Onset   Mental illness Mother    Hypertension Father     Social History   Socioeconomic History   Marital status: Single    Spouse name: Not on file   Number of children: Not on file   Years of education: Not on file   Highest education level: Not on file  Occupational History   Not on file  Tobacco Use   Smoking status: Every Day    Types: E-cigarettes   Smokeless tobacco: Never   Tobacco comments:    0.5 ppd -02/02/2021   Vaping Use   Vaping Use: Every day  Substance and Sexual Activity   Alcohol use: No    Alcohol/week: 0.0 standard drinks of alcohol   Drug use: No   Sexual activity: Yes  Other Topics Concern   Not on file  Social History Narrative   Not on file   Social Determinants of Health   Financial Resource Strain: Not on file  Food Insecurity: Not on file  Transportation Needs: Not on file  Physical Activity: Not on file  Stress: Not  on file  Social Connections: Not on file  Intimate Partner Violence: Not on file     Constitutional: Denies fever, malaise, fatigue, headache or abrupt weight changes.  HEENT: Denies eye pain, eye redness, ear pain, ringing in the ears, wax buildup, runny nose, nasal congestion, bloody nose, or sore throat. Respiratory: Denies difficulty breathing, shortness of breath, cough or sputum production.   Cardiovascular: Denies chest pain, chest tightness, palpitations or swelling in the hands or feet.  Gastrointestinal: Denies abdominal pain, bloating, constipation, diarrhea or blood in the stool.  GU: Denies urgency, frequency, pain with urination, burning sensation, blood in urine, odor or discharge. Musculoskeletal: Denies decrease in range of motion, difficulty with gait, muscle pain or joint pain and swelling.  Skin: Denies redness, rashes, lesions or ulcercations.  Neurological: Patient reports dizziness.  Denies difficulty with memory, difficulty with speech or problems with balance and coordination.  Psych: Denies anxiety, depression, SI/HI.  No other specific complaints in a complete review of systems (except as listed in HPI above).     Objective:   Physical Exam  BP 128/82 (BP Location: Right Arm, Patient Position: Sitting, Cuff Size: Normal)   Pulse 75   Temp (!) 96.9 F (36.1 C) (Temporal)   Wt 222 lb (100.7 kg)  SpO2 99%   BMI 33.75 kg/m   Wt Readings from Last 3 Encounters:  10/12/21 224 lb (101.6 kg)  12/25/20 236 lb 6.4 oz (107.2 kg)  09/09/20 230 lb 3.2 oz (104.4 kg)    General: Appears his stated age, well developed, well nourished in NAD. Skin: Warm, dry and intact. No rashes, lesions or ulcerations noted. HEENT: Head: normal shape and size; Eyes: sclera white, no icterus, conjunctiva pink, PERRLA and EOMs intact; Ears: Tm's gray and intact, normal light reflex; Nose: mucosa pink and moist, septum midline; Throat/Mouth: Teeth present, mucosa pink and moist, no  exudate, lesions or ulcerations noted.  Neck:  Neck supple, trachea midline. No masses, lumps or thyromegaly present.  Cardiovascular: Normal rate and rhythm. S1,S2 noted.  No murmur, rubs or gallops noted. No JVD or BLE edema. No carotid bruits noted. Pulmonary/Chest: Normal effort and positive vesicular breath sounds. No respiratory distress. No wheezes, rales or ronchi noted.  Abdomen: Soft and nontender. Normal bowel sounds. No distention or masses noted. Liver, spleen and kidneys non palpable. Musculoskeletal: Normal range of motion. No signs of joint swelling. No difficulty with gait.  Neurological: Alert and oriented. Cranial nerves II-XII grossly intact. Coordination normal.  Psychiatric: Mood and affect normal. Behavior is normal. Judgment and thought content normal.    BMET    Component Value Date/Time   NA 140 10/12/2021 1527   NA 142 01/14/2013 1416   K 3.8 10/12/2021 1527   K 4.1 01/14/2013 1416   CL 103 10/12/2021 1527   CL 109 (H) 01/14/2013 1416   CO2 26 10/12/2021 1527   CO2 27 01/14/2013 1416   GLUCOSE 82 10/12/2021 1527   GLUCOSE 113 (H) 01/14/2013 1416   BUN 12 10/12/2021 1527   BUN 11 01/14/2013 1416   CREATININE 0.71 10/12/2021 1527   CALCIUM 9.5 10/12/2021 1527   CALCIUM 9.3 01/14/2013 1416   GFRNONAA >60 01/14/2013 1416   GFRAA >60 01/14/2013 1416    Lipid Panel     Component Value Date/Time   CHOL 119 10/12/2021 1527   TRIG 87 10/12/2021 1527   HDL 48 10/12/2021 1527   CHOLHDL 2.5 10/12/2021 1527   VLDL 25.4 02/12/2019 1546   LDLCALC 54 10/12/2021 1527    CBC    Component Value Date/Time   WBC 6.5 10/12/2021 1527   RBC 4.89 10/12/2021 1527   HGB 14.4 10/12/2021 1527   HGB 15.5 01/14/2013 1416   HCT 42.4 10/12/2021 1527   HCT 48.2 01/14/2013 1416   PLT 190 10/12/2021 1527   PLT 205 01/14/2013 1416   MCV 86.7 10/12/2021 1527   MCV 85 01/14/2013 1416   MCH 29.4 10/12/2021 1527   MCHC 34.0 10/12/2021 1527   RDW 12.5 10/12/2021 1527   RDW  14.0 01/14/2013 1416   LYMPHSABS 2.6 01/11/2014 1540   MONOABS 0.9 01/11/2014 1540   EOSABS 0.2 01/11/2014 1540   BASOSABS 0.1 01/11/2014 1540    Hgb A1C Lab Results  Component Value Date   HGBA1C 5.0 10/12/2021            Assessment & Plan:     RTC in 3 months for follow-up of chronic conditions Nicki Reaper, NP

## 2022-01-13 ENCOUNTER — Encounter: Payer: Self-pay | Admitting: Internal Medicine

## 2022-01-13 NOTE — Patient Instructions (Signed)
Dizziness Dizziness is a common problem. It makes you feel unsteady or light-headed. You may feel like you are about to pass out (faint). Dizziness can lead to getting hurt if you stumble or fall. Dizziness can be caused by many things, including: Medicines. Not having enough water in your body (dehydration). Illness. Follow these instructions at home: Eating and drinking  Drink enough fluid to keep your pee (urine) pale yellow. This helps to keep you from getting dehydrated. Try to drink more clear fluids, such as water. Do not drink alcohol. Limit how much caffeine you drink or eat, if your doctor tells you to do that. Limit how much salt (sodium) you drink or eat, if your doctor tells you to do that. Activity  Avoid making quick movements. Stand up slowly from sitting in a chair, and steady yourself until you feel okay. In the morning, first sit up on the side of the bed. When you feel okay, stand up slowly while you hold onto something. Do this until you know that your balance is okay. If you need to stand in one place for a long time, move your legs often. Tighten and relax the muscles in your legs while you are standing. Do not drive or use machinery if you feel dizzy. Avoid bending down if you feel dizzy. Place items in your home so you can reach them easily without leaning over. Lifestyle Do not smoke or use any products that contain nicotine or tobacco. If you need help quitting, ask your doctor. Try to lower your stress level. You can do this by using methods such as yoga or meditation. Talk with your doctor if you need help. General instructions Watch your dizziness for any changes. Take over-the-counter and prescription medicines only as told by your doctor. Talk with your doctor if you think that you are dizzy because of a medicine that you are taking. Tell a friend or a family member that you are feeling dizzy. If he or she notices any changes in your behavior, have this  person call your doctor. Keep all follow-up visits. Contact a doctor if: Your dizziness does not go away. Your dizziness or light-headedness gets worse. You feel like you may vomit (are nauseous). You have trouble hearing. You have new symptoms. You are unsteady on your feet. You feel like the room is spinning. You have neck pain or a stiff neck. You have a fever. Get help right away if: You vomit or have watery poop (diarrhea), and you cannot eat or drink anything. You have trouble: Talking. Walking. Swallowing. Using your arms, hands, or legs. You feel generally weak. You are not thinking clearly, or you have trouble forming sentences. A friend or family member may notice this. You have: Chest pain. Pain in your belly (abdomen). Shortness of breath. Sweating. Your vision changes. You are bleeding. You have a very bad headache. These symptoms may be an emergency. Get help right away. Call your local emergency services (911 in the [place] Do not wait to see if the symptoms will go away. Do not drive yourself to the hospital. Summary Dizziness makes you feel unsteady or light-headed. You may feel like you are about to pass out (faint). Drink enough fluid to keep your pee (urine) pale yellow. Do not drink alcohol. Avoid making quick movements if you feel dizzy. Watch your dizziness for any changes. This information is not intended to replace advice given to you by your health care provider. Make sure you discuss any questions   you have with your health care provider. Document Revised: 12/17/2019 Document Reviewed: 12/17/2019 Elsevier Patient Education  2023 Elsevier Inc.  

## 2022-03-29 ENCOUNTER — Encounter: Payer: Self-pay | Admitting: Adult Health

## 2022-03-29 ENCOUNTER — Telehealth (INDEPENDENT_AMBULATORY_CARE_PROVIDER_SITE_OTHER): Payer: BC Managed Care – PPO | Admitting: Adult Health

## 2022-03-29 DIAGNOSIS — G4733 Obstructive sleep apnea (adult) (pediatric): Secondary | ICD-10-CM

## 2022-03-29 NOTE — Patient Instructions (Signed)
Order for Dream wear nasal.  Change CPAP pressure to 15 cm H2O. CPAP download in 1 month Wear CPAP At bedtime  each night for at least 6 hr  Work on healthy weight loss- great job Keep working on quitting smoking  Follow up with Dr. Mortimer Fries in 6 months and As needed

## 2022-03-29 NOTE — Progress Notes (Signed)
Virtual Visit via Video Note  I connected with Anthony Myers on 03/29/22 at  3:00 PM EST by a video enabled telemedicine application and verified that I am speaking with the correct person using two identifiers.  Location: Patient: Home  Provider: Office    I discussed the limitations of evaluation and management by telemedicine and the availability of in person appointments. The patient expressed understanding and agreed to proceed.  History of Present Illness: 42 year old male followed for severe obstructive sleep apnea  Today's video visit is a 1 year follow-up for sleep apnea.  Patient says he continues to try to use his CPAP every single night.  Says it still remains very uncomfortable.  Last visit patient was recommended to change over from a fullface mask to DreamWear nasal mask unfortunately homecare company did not change his mask.  Patient says he still wakes up in the [place] with lots of pressure and mask leaking.  Patient is a Administrator.  CPAP download shows 97% compliance.  Daily average usage at 4.5 hours.  Patient is on auto CPAP 14 to 20 cm H2O.  Daily average pressure at 17.8 cm H2O.  AHI 1.5/hour.  Past Medical History:  Diagnosis Date   Anxiety    Depression    Insomnia     Current Outpatient Medications on File Prior to Visit  Medication Sig Dispense Refill   blood glucose meter kit and supplies Dispense based on patient and insurance preference. Use to check blood sugar daily as directed. DX E11.9 (Patient not taking: Reported on 03/29/2022) 1 each 0   No current facility-administered medications on file prior to visit.       Observations/Objective: 12/2020 HST Showed severe obstructive sleep apnea with AHI at 31.3/hour with SPO2 low at 82%.  **CPAP titration 06/27/2017>> recommended auto CPAP with pressure range 14-20. **Home sleep test 05/30/2017>> severe obstructive sleep apnea with AHI 70.  Appears well in no acute distress  Assessment and Plan: Severe  obstructive sleep apnea.  Patient is encouraged on CPAP daily usage.  Will change CPAP pressure to 15 cm H2O.  Change CPAP mask to DreamWear nasal mask.  Download in 1 month.  Patient to follow back up in 6 months.  Plan  Patient Instructions  Order for Dream wear nasal.  Change CPAP pressure to 15 cm H2O. CPAP download in 1 month Wear CPAP At bedtime  each night for at least 6 hr  Work on healthy weight loss- great job Keep working on quitting smoking  Follow up with Dr. Mortimer Fries in 6 months and As needed       Follow Up Instructions:    I discussed the assessment and treatment plan with the patient. The patient was provided an opportunity to ask questions and all were answered. The patient agreed with the plan and demonstrated an understanding of the instructions.   The patient was advised to call back or seek an in-person evaluation if the symptoms worsen or if the condition fails to improve as anticipated.  I provided 22 minutes of non-face-to-face time during this encounter.   Rexene Edison, NP

## 2022-03-29 NOTE — Addendum Note (Signed)
Addended by: Vanessa Barbara on: 03/29/2022 03:51 PM   Modules accepted: Orders

## 2022-03-29 NOTE — Progress Notes (Signed)
Had [place] covid vaccine.  No flu vaccine for 2023.

## 2022-06-17 IMAGING — CR DG LUMBAR SPINE COMPLETE 4+V
1 series · 5 of 5 positions shown · non-contrast
Comparison: None.

CLINICAL DATA: Low back pain radiating into the legs, initial
encounter

EXAM:
LUMBAR SPINE - COMPLETE 4+ VIEW

[Series 1: dg lumbar spine complete 4 +v · 0.14mm/px · 5 of 5 slices shown]
[im 1/5]
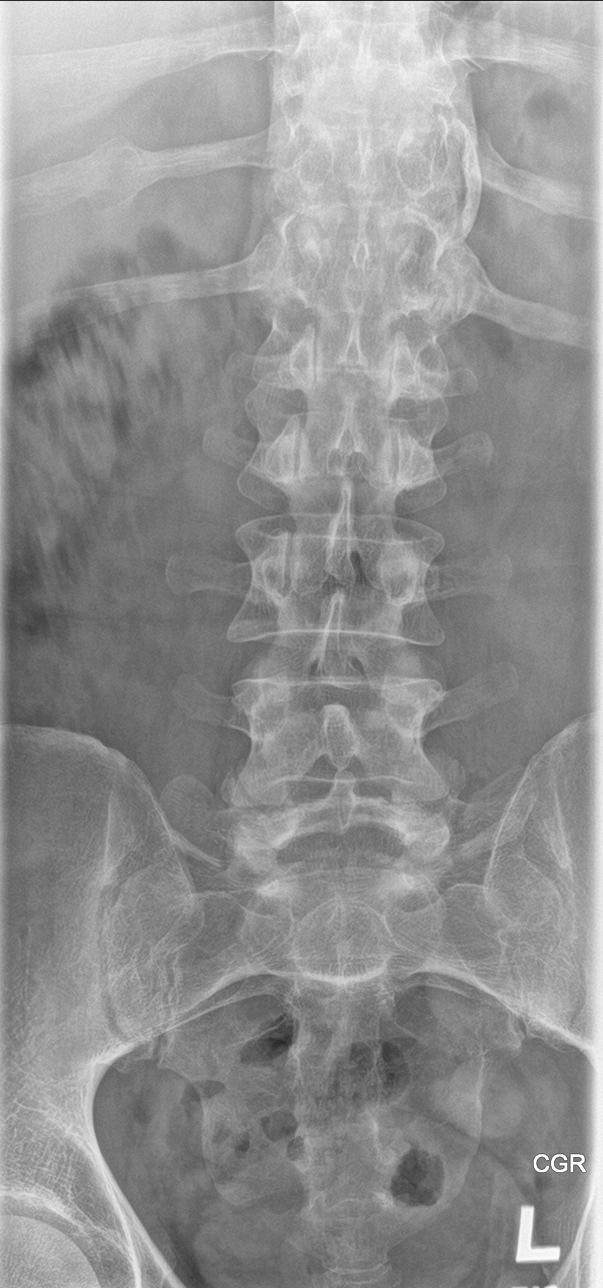
[im 2/5]
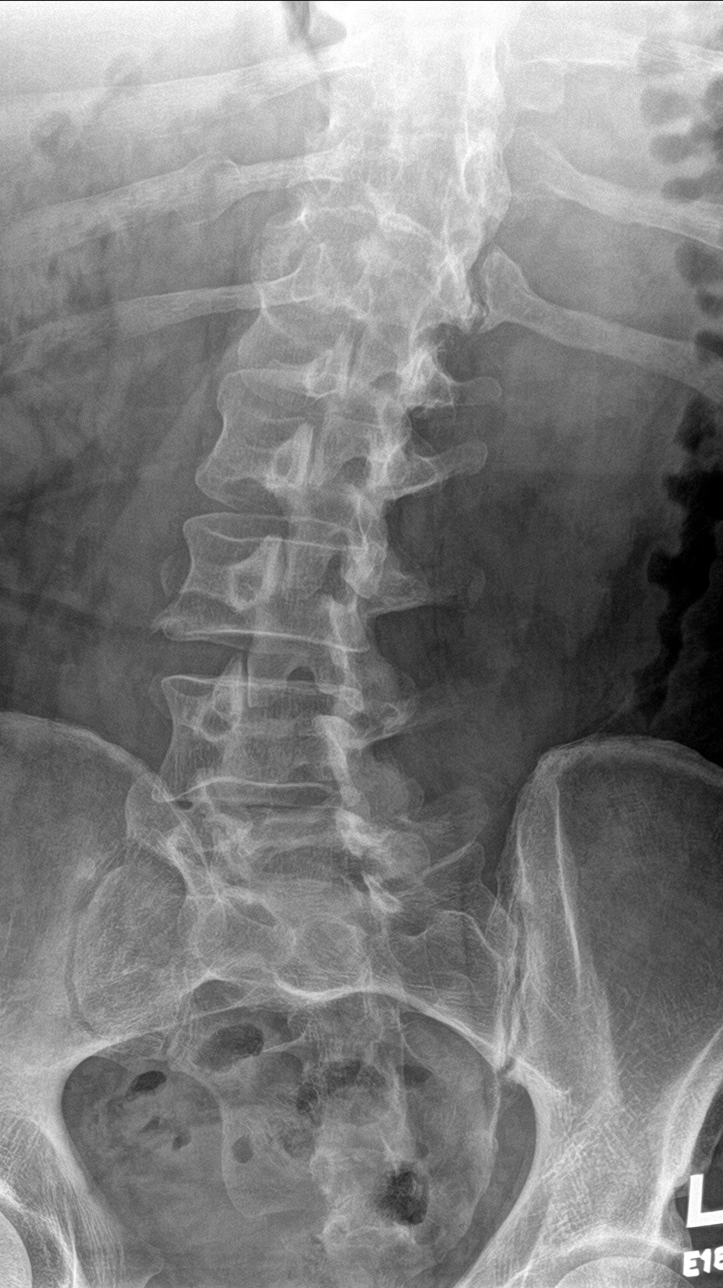
[im 3/5]
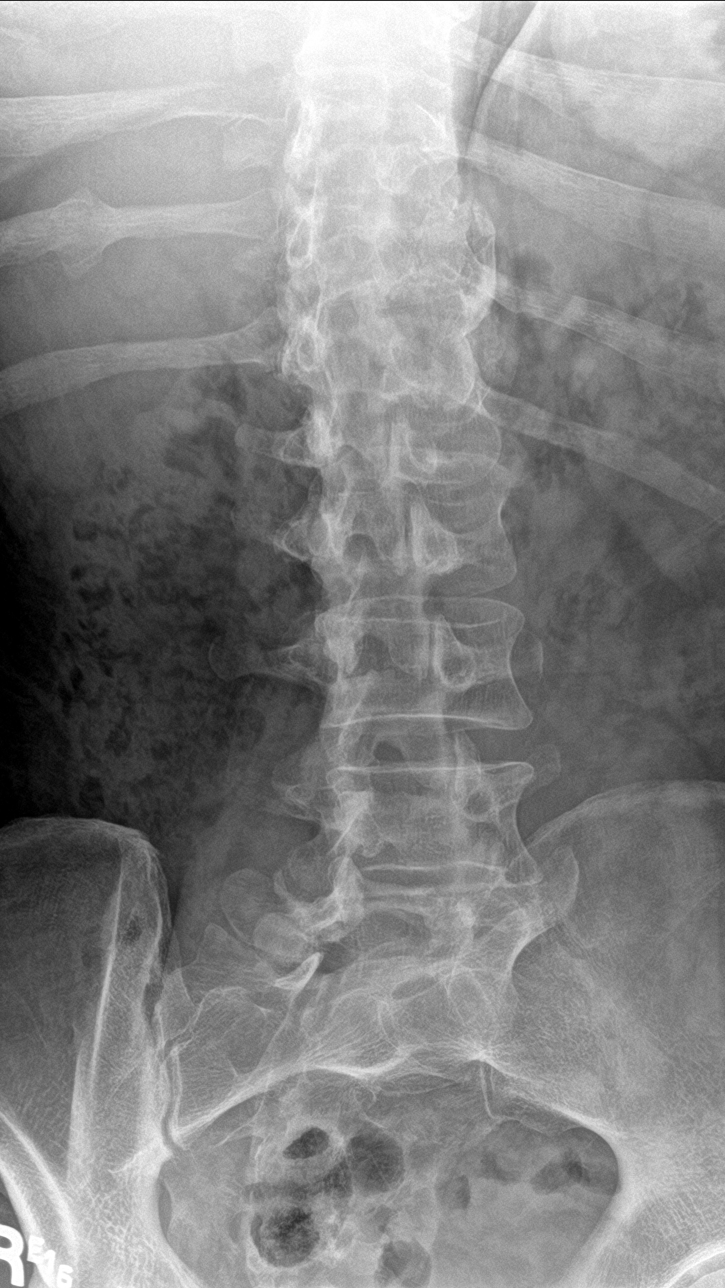
[im 4/5]
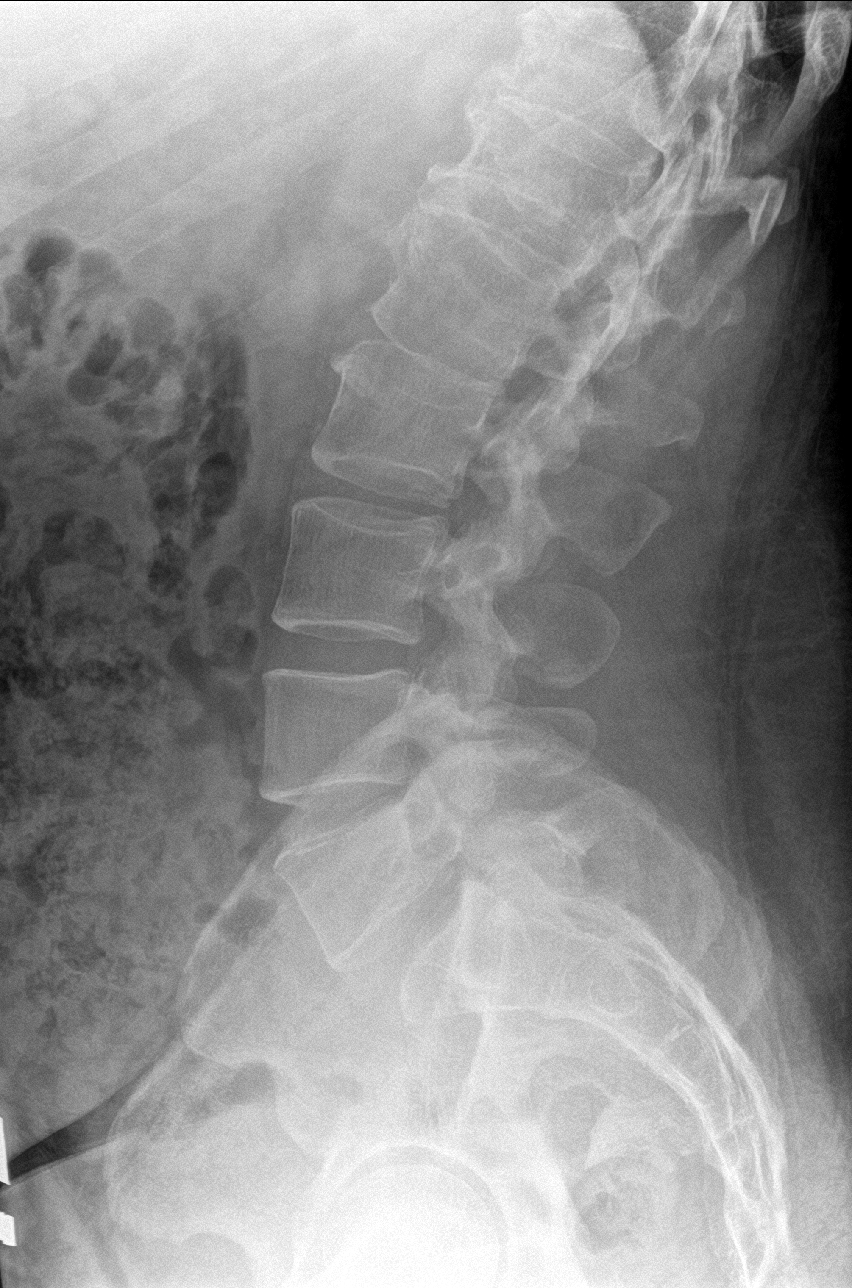
[im 5/5]
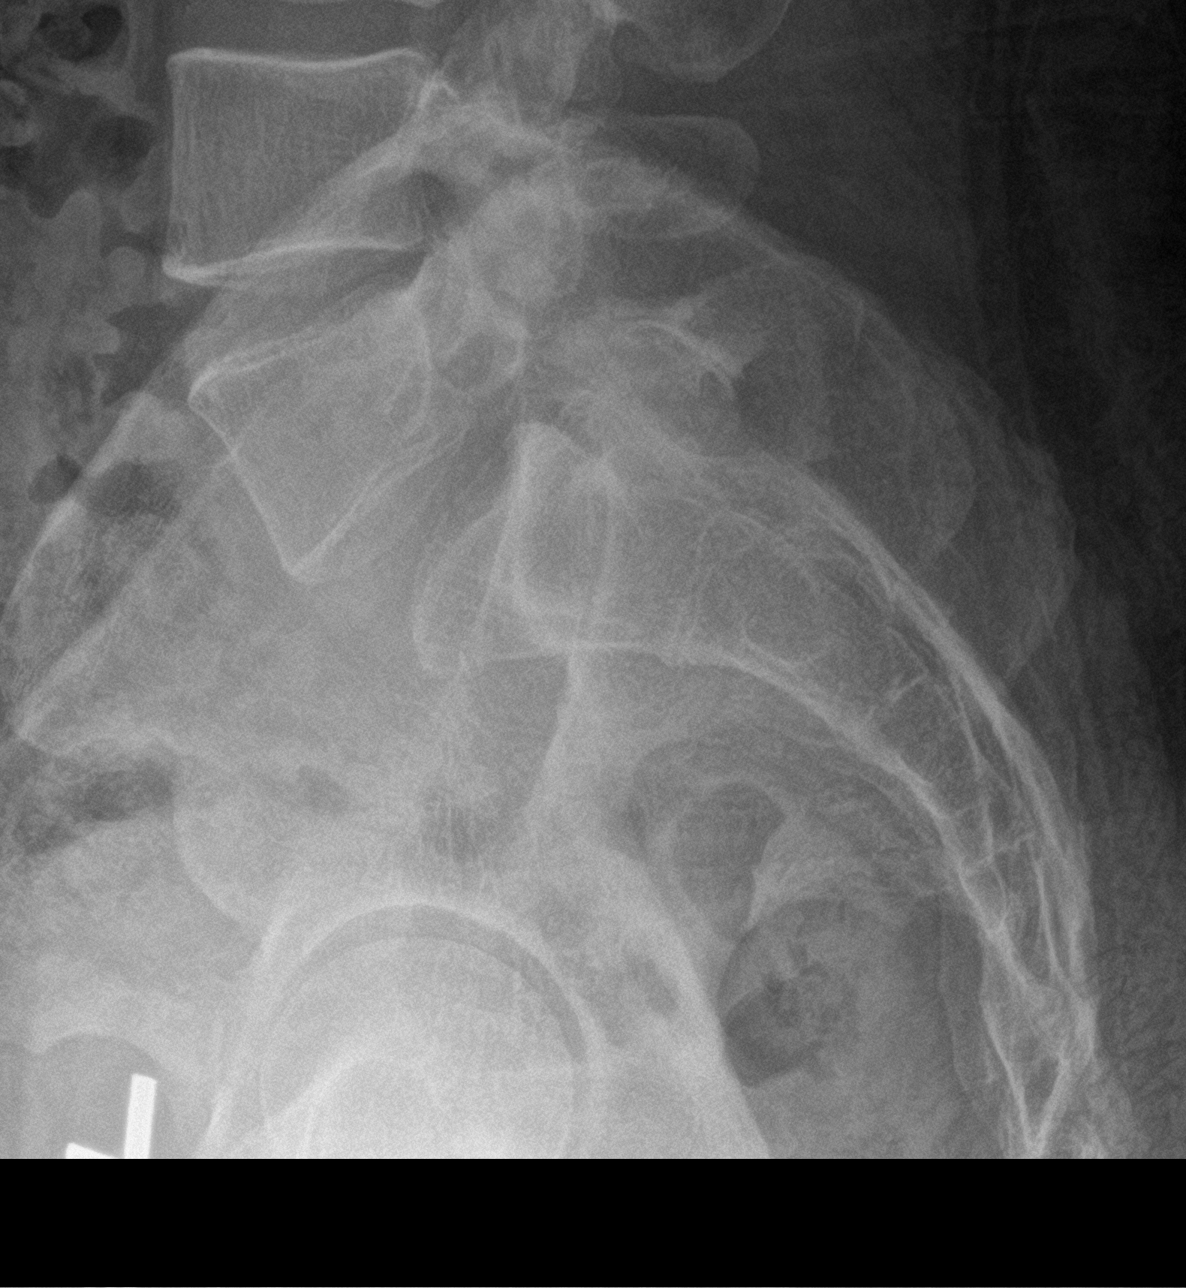

[5 of 5 positions shown; findings below may reference images not displayed]

FINDINGS: Five lumbar type vertebral bodies are well visualized. Vertebral
body height is well maintained. L5 pars defects are noted
bilaterally with mild anterolisthesis of L5 on S1. Mild osteophytic
changes are noted in the upper lumbar spine. No soft tissue
abnormality is seen.
IMPRESSION: Pars defects at L5 with mild anterolisthesis.

## 2022-10-18 ENCOUNTER — Ambulatory Visit (INDEPENDENT_AMBULATORY_CARE_PROVIDER_SITE_OTHER): Payer: BC Managed Care – PPO | Admitting: Internal Medicine

## 2022-10-18 ENCOUNTER — Encounter: Payer: Self-pay | Admitting: Internal Medicine

## 2022-10-18 ENCOUNTER — Ambulatory Visit: Payer: BC Managed Care – PPO | Admitting: Internal Medicine

## 2022-10-18 VITALS — BP 132/86 | HR 74 | Ht 68.0 in | Wt 240.0 lb

## 2022-10-18 DIAGNOSIS — E119 Type 2 diabetes mellitus without complications: Secondary | ICD-10-CM | POA: Diagnosis not present

## 2022-10-18 DIAGNOSIS — Z6836 Body mass index (BMI) 36.0-36.9, adult: Secondary | ICD-10-CM

## 2022-10-18 DIAGNOSIS — Z0001 Encounter for general adult medical examination with abnormal findings: Secondary | ICD-10-CM

## 2022-10-18 NOTE — Assessment & Plan Note (Signed)
Encouraged diet and exercise for weight loss ?

## 2022-10-18 NOTE — Progress Notes (Signed)
Subjective:    Patient ID: Anthony Myers, male    DOB: 1980/05/26, 42 y.o.   MRN: [id number]  HPI  Patient presents to clinic today for his annual exam.  Flu: 02/2015 Tetanus: 09/2014 COVID: x 2 Vision screening: as needed Dentist: as needed  Diet: He does eat meat. He consumes fruits and veggies. He does eat fried foods. He drinks mostly water, some dt soda Exercise: None  Review of Systems     Past Medical History:  Diagnosis Date   Anxiety    Depression    Insomnia     Current Outpatient Medications  Medication Sig Dispense Refill   blood glucose meter kit and supplies Dispense based on patient and insurance preference. Use to check blood sugar daily as directed. DX E11.9 (Patient not taking: Reported on 03/29/2022) 1 each 0   No current facility-administered medications for this visit.    Allergies  Allergen Reactions   Tylenol [Acetaminophen] Swelling    "feels bad"    Family History  Problem Relation Age of Onset   Mental illness Mother    Hypertension Father     Social History   Socioeconomic History   Marital status: Single    Spouse name: Not on file   Number of children: Not on file   Years of education: Not on file   Highest education level: Not on file  Occupational History   Not on file  Tobacco Use   Smoking status: Every Day    Types: E-cigarettes   Smokeless tobacco: Never   Tobacco comments:    0.5 ppd -02/02/2021   Vaping Use   Vaping status: Every Day  Substance and Sexual Activity   Alcohol use: No    Alcohol/week: 0.0 standard drinks of alcohol   Drug use: No   Sexual activity: Yes  Other Topics Concern   Not on file  Social History Narrative   Not on file   Social Determinants of Health   Financial Resource Strain: Not on file  Food Insecurity: Not on file  Transportation Needs: Not on file  Physical Activity: Not on file  Stress: Not on file  Social Connections: Unknown (05/29/2021)   [facility] from [facility]   Social Network    Social Network: Not on file  Intimate Partner Violence: Unknown (04/27/2021)   Received from [facility]   HITS    Physically Hurt: Not on file    Insult or Talk Down To: Not on file    Threaten Physical Harm: Not on file    Scream or Curse: Not on file     Constitutional: Denies fever, malaise, fatigue, headache or abrupt weight changes.  HEENT: Denies eye pain, eye redness, ear pain, ringing in the ears, wax buildup, runny nose, nasal congestion, bloody nose, or sore throat. Respiratory: Denies difficulty breathing, shortness of breath, cough or sputum production.   Cardiovascular: Denies chest pain, chest tightness, palpitations or swelling in the hands or feet.  Gastrointestinal: Denies abdominal pain, bloating, constipation, diarrhea or blood in the stool.  GU: Denies urgency, frequency, pain with urination, burning sensation, blood in urine, odor or discharge. Musculoskeletal: Denies decrease in range of motion, difficulty with gait, muscle pain or joint pain and swelling.  Skin: Denies redness, rashes, lesions or ulcercations.  Neurological: Denies dizziness, difficulty with memory, difficulty with speech or problems with balance and coordination.  Psych: Patient has a history of anxiety and depression.  Denies SI/HI.  No other  specific complaints in a complete review of systems (except as listed in HPI above).  Objective:   Physical Exam BP 132/86 (BP Location: Right Arm, Patient Position: Sitting, Cuff Size: Large)   Pulse 74   Ht 5\' 8"  (1.727 m)   Wt 240 lb (108.9 kg)   SpO2 99%   BMI 36.49 kg/m   Wt Readings from Last 3 Encounters:  01/12/22 222 lb (100.7 kg)  10/12/21 224 lb (101.6 kg)  12/25/20 236 lb 6.4 oz (107.2 kg)    General: Appears his stated age, obese, in NAD. Skin: Warm, dry and intact.  HEENT: Head: normal shape and size; Eyes: sclera white, no icterus, conjunctiva pink, PERRLA and EOMs intact;  Neck:  Neck  supple, trachea midline. No masses, lumps or thyromegaly present.  Cardiovascular: Normal rate and rhythm. S1,S2 noted.  No murmur, rubs or gallops noted. No JVD or BLE edema.  Pulmonary/Chest: Normal effort and positive vesicular breath sounds. No respiratory distress. No wheezes, rales or ronchi noted.  Abdomen: Soft and nontender. Normal bowel sounds.  Musculoskeletal: Strength 55 BUE/BLE. No difficulty with gait.  Neurological: Alert and oriented. Cranial nerves II-XII grossly intact. Coordination normal.  Psychiatric: Mood and affect normal. Behavior is normal. Judgment and thought content normal.     BMET    Component Value Date/Time   NA 140 10/12/2021 1527   NA 142 01/14/2013 1416   K 3.8 10/12/2021 1527   K 4.1 01/14/2013 1416   CL 103 10/12/2021 1527   CL 109 (H) 01/14/2013 1416   CO2 26 10/12/2021 1527   CO2 27 01/14/2013 1416   GLUCOSE 82 10/12/2021 1527   GLUCOSE 113 (H) 01/14/2013 1416   BUN 12 10/12/2021 1527   BUN 11 01/14/2013 1416   CREATININE 0.71 10/12/2021 1527   CALCIUM 9.5 10/12/2021 1527   CALCIUM 9.3 01/14/2013 1416   GFRNONAA >60 01/14/2013 1416   GFRAA >60 01/14/2013 1416    Lipid Panel     Component Value Date/Time   CHOL 119 10/12/2021 1527   TRIG 87 10/12/2021 1527   HDL 48 10/12/2021 1527   CHOLHDL 2.5 10/12/2021 1527   VLDL 25.4 02/12/2019 1546   LDLCALC 54 10/12/2021 1527    CBC    Component Value Date/Time   WBC 6.5 10/12/2021 1527   RBC 4.89 10/12/2021 1527   HGB 14.4 10/12/2021 1527   HGB 15.5 01/14/2013 1416   HCT 42.4 10/12/2021 1527   HCT 48.2 01/14/2013 1416   PLT 190 10/12/2021 1527   PLT 205 01/14/2013 1416   MCV 86.7 10/12/2021 1527   MCV 85 01/14/2013 1416   MCH 29.4 10/12/2021 1527   MCHC 34.0 10/12/2021 1527   RDW 12.5 10/12/2021 1527   RDW 14.0 01/14/2013 1416   LYMPHSABS 2.6 01/11/2014 1540   MONOABS 0.9 01/11/2014 1540   EOSABS 0.2 01/11/2014 1540   BASOSABS 0.1 01/11/2014 1540    Hgb A1C Lab Results   Component Value Date   HGBA1C 5.0 10/12/2021            Assessment & Plan:  Preventative health maintenance:  Flu shot declined Tetanus UTD Encouraged him to get his COVID-vaccine Encouraged him to consume a balanced diet and exercise regimen Advised him to see an eye doctor and dentist annually Will check CBC, c-Met, lipid, A1c and urine microalbumin today  RTC in 6 months, follow-up chronic conditions Nicki Reaper, NP

## 2022-10-18 NOTE — Patient Instructions (Signed)
Health Maintenance, Male Adopting a healthy lifestyle and getting preventive care are important in promoting health and wellness. Ask your health care provider about: The right schedule for you to have regular tests and exams. Things you can do on your own to prevent diseases and keep yourself healthy. What should I know about diet, weight, and exercise? Eat a healthy diet  Eat a diet that includes plenty of vegetables, fruits, low-fat dairy products, and lean protein. Do not eat a lot of foods that are high in solid fats, added sugars, or sodium. Maintain a healthy weight Body mass index (BMI) is a measurement that can be used to identify possible weight problems. It estimates body fat based on height and weight. Your health care provider can help determine your BMI and help you achieve or maintain a healthy weight. Get regular exercise Get regular exercise. This is one of the most important things you can do for your health. Most adults should: Exercise for at least 150 minutes each week. The exercise should increase your heart rate and make you sweat (moderate-intensity exercise). Do strengthening exercises at least twice a week. This is in addition to the moderate-intensity exercise. Spend less time sitting. Even light physical activity can be beneficial. Watch cholesterol and blood lipids Have your blood tested for lipids and cholesterol at 42 years of age, then have this test every 5 years. You may need to have your cholesterol levels checked more often if: Your lipid or cholesterol levels are high. You are older than 42 years of age. You are at high risk for heart disease. What should I know about cancer screening? Many types of cancers can be detected early and may often be prevented. Depending on your health history and family history, you may need to have cancer screening at various ages. This may include screening for: Colorectal cancer. Prostate cancer. Skin cancer. Lung  cancer. What should I know about heart disease, diabetes, and high blood pressure? Blood pressure and heart disease High blood pressure causes heart disease and increases the risk of stroke. This is more likely to develop in people who have high blood pressure readings or are overweight. Talk with your health care provider about your target blood pressure readings. Have your blood pressure checked: Every 3-5 years if you are 18-39 years of age. Every year if you are 40 years old or older. If you are between the ages of 65 and 75 and are a current or former smoker, ask your health care provider if you should have a one-time screening for abdominal aortic aneurysm (AAA). Diabetes Have regular diabetes screenings. This checks your fasting blood sugar level. Have the screening done: Once every three years after age 45 if you are at a normal weight and have a low risk for diabetes. More often and at a younger age if you are overweight or have a high risk for diabetes. What should I know about preventing infection? Hepatitis B If you have a higher risk for hepatitis B, you should be screened for this virus. Talk with your health care provider to find out if you are at risk for hepatitis B infection. Hepatitis C Blood testing is recommended for: Everyone born from 1945 through 1965. Anyone with known risk factors for hepatitis C. Sexually transmitted infections (STIs) You should be screened each year for STIs, including gonorrhea and chlamydia, if: You are sexually active and are younger than 42 years of age. You are older than 42 years of age and your   health care provider tells you that you are at risk for this type of infection. Your sexual activity has changed since you were last screened, and you are at increased risk for chlamydia or gonorrhea. Ask your health care provider if you are at risk. Ask your health care provider about whether you are at high risk for HIV. Your health care provider  may recommend a prescription medicine to help prevent HIV infection. If you choose to take medicine to prevent HIV, you should first get tested for HIV. You should then be tested every 3 months for as long as you are taking the medicine. Follow these instructions at home: Alcohol use Do not drink alcohol if your health care provider tells you not to drink. If you drink alcohol: Limit how much you have to 0-2 drinks a day. Know how much alcohol is in your drink. In the [place] one drink equals one 12 oz bottle of beer (355 mL), one 5 oz glass of wine (148 mL), or one 1 oz glass of hard liquor (44 mL). Lifestyle Do not use any products that contain nicotine or tobacco. These products include cigarettes, chewing tobacco, and vaping devices, such as e-cigarettes. If you need help quitting, ask your health care provider. Do not use street drugs. Do not share needles. Ask your health care provider for help if you need support or information about quitting drugs. General instructions Schedule regular health, dental, and eye exams. Stay current with your vaccines. Tell your health care provider if: You often feel depressed. You have ever been abused or do not feel safe at home. Summary Adopting a healthy lifestyle and getting preventive care are important in promoting health and wellness. Follow your health care provider's instructions about healthy diet, exercising, and getting tested or screened for diseases. Follow your health care provider's instructions on monitoring your cholesterol and blood pressure. This information is not intended to replace advice given to you by your health care provider. Make sure you discuss any questions you have with your health care provider. Document Revised: 06/02/2020 Document Reviewed: 06/02/2020 Elsevier Patient Education  2024 Elsevier Inc.  

## 2022-10-19 LAB — HEMOGLOBIN A1C
Hgb A1c MFr Bld: 5.4 % of total Hgb (ref <5.7)
Mean Plasma Glucose: 108 mg/dL
eAG (mmol/L): 6 mmol/L

## 2022-10-19 LAB — LIPID PANEL
Cholesterol: 107 mg/dL (ref <200)
HDL: 45 mg/dL (ref >=40)
LDL Cholesterol (Calc): 49 mg/dL (calc)
Non-HDL Cholesterol (Calc): 62 mg/dL (calc) (ref <130)
Total CHOL/HDL Ratio: 2.4 (calc) (ref <5.0)
Triglycerides: 54 mg/dL (ref <150)

## 2022-10-19 LAB — MICROALBUMIN / CREATININE URINE RATIO
Creatinine, Urine: 181 mg/dL (ref 20–320)
Microalb Creat Ratio: 12 mg/g creat (ref <30)
Microalb, Ur: 2.1 mg/dL

## 2022-10-19 LAB — CBC
HCT: 44.8 % (ref 38.5–50.0)
Hemoglobin: 15.1 g/dL (ref 13.2–17.1)
MCH: 29 pg (ref 27.0–33.0)
MCHC: 33.7 g/dL (ref 32.0–36.0)
MCV: 86.2 fL (ref 80.0–100.0)
MPV: 10.1 fL (ref 7.5–12.5)
Platelets: 217 10*3/uL (ref 140–400)
RBC: 5.2 10*6/uL (ref 4.20–5.80)
RDW: 12.4 % (ref 11.0–15.0)
WBC: 7.4 10*3/uL (ref 3.8–10.8)

## 2022-10-19 LAB — COMPLETE METABOLIC PANEL WITH GFR
AG Ratio: 1.9 (calc) (ref 1.0–2.5)
ALT: 30 U/L (ref 9–46)
AST: 21 U/L (ref 10–40)
Albumin: 5 g/dL (ref 3.6–5.1)
Alkaline phosphatase (APISO): 60 U/L (ref 36–130)
BUN: 10 mg/dL (ref 7–25)
CO2: 28 mmol/L (ref 20–32)
Calcium: 9.8 mg/dL (ref 8.6–10.3)
Chloride: 105 mmol/L (ref 98–110)
Creat: 0.76 mg/dL (ref 0.60–1.29)
Globulin: 2.7 g/dL (calc) (ref 1.9–3.7)
Glucose, Bld: 82 mg/dL (ref 65–99)
Potassium: 4.4 mmol/L (ref 3.5–5.3)
Sodium: 143 mmol/L (ref 135–146)
Total Bilirubin: 0.6 mg/dL (ref 0.2–1.2)
Total Protein: 7.7 g/dL (ref 6.1–8.1)
eGFR: 115 mL/min/{1.73_m2} (ref >=60)

## 2022-11-17 LAB — HM DIABETES EYE EXAM

## 2022-12-15 ENCOUNTER — Ambulatory Visit: Payer: BC Managed Care – PPO | Admitting: Internal Medicine

## 2022-12-15 ENCOUNTER — Encounter: Payer: Self-pay | Admitting: Internal Medicine

## 2022-12-15 VITALS — BP 112/76 | HR 89 | Temp 97.6°F | Ht 68.0 in | Wt 238.6 lb

## 2022-12-15 DIAGNOSIS — F17210 Nicotine dependence, cigarettes, uncomplicated: Secondary | ICD-10-CM | POA: Diagnosis not present

## 2022-12-15 DIAGNOSIS — E66812 Obesity, class 2: Secondary | ICD-10-CM

## 2022-12-15 DIAGNOSIS — Z6835 Body mass index (BMI) 35.0-35.9, adult: Secondary | ICD-10-CM

## 2022-12-15 DIAGNOSIS — G4733 Obstructive sleep apnea (adult) (pediatric): Secondary | ICD-10-CM

## 2022-12-15 NOTE — Patient Instructions (Signed)
Recommend Mask Fitting Desensitization referral  Change AUTO CPAP 6-14 cm h20  Recommend weight loss  Avoid secondhand smoke Avoid SICK contacts Recommend  Masking  when appropriate Recommend Keep up-to-date with vaccinations

## 2022-12-15 NOTE — Progress Notes (Unsigned)
Observations/Objective: 12/2020 HST Showed severe obstructive sleep apnea with AHI at 31.3/hour with SPO2 low at 82%.  **CPAP titration 06/27/2017>> recommended auto CPAP with pressure range 14-20. **Home sleep test 05/30/2017>> severe obstructive sleep apnea with AHI 70.   CC Follow-up assessment for severe sleep apnea  History of Present Illness: 42 year old male followed for severe obstructive sleep apnea  In 2019 patient was diagnosed with severe sleep apnea with AHI of 70 Patient weight approximately 240 pounds  Patient had lost significant amount of weight down to 185 pounds patient underwent home sleep study which showed reviewed sleep apnea of AHI of 30  Patient was started on CPAP therapy Last previous office visits patient was compliant with his CPAP  Current download 11/2022 Auto CPAP 14-20 AHI reduced to 0.7 Patient is having a hard time with too much pressure Patient is having a hard time with his mask Patient works for the [employer] Patient has been very compliant with his CPAP More energy less fatigue   No exacerbation at this time No evidence of heart failure at this time No evidence or signs of infection at this time No respiratory distress No fevers, chills, nausea, vomiting, diarrhea No evidence of lower extremity edema No evidence hemoptysis  Patient currently vapes and smokes Patient current weight is 238 pounds Recommend weight loss  Past Medical History:  Diagnosis Date   Anxiety    Depression    Insomnia     Current Outpatient Medications on File Prior to Visit  Medication Sig Dispense Refill   blood glucose meter kit and supplies Dispense based on patient and insurance preference. Use to check blood sugar daily as directed. DX E11.9 (Patient not taking: Reported on 03/29/2022) 1 each 0   No current facility-administered medications on file prior to visit.     BP 112/76 (BP Location: Left Arm, Patient Position: Sitting, Cuff Size: Normal)    Pulse 89   Temp 97.6 F (36.4 C) (Temporal)   Ht 5\' 8"  (1.727 m)   Wt 238 lb 9.6 oz (108.2 kg)   SpO2 97%   BMI 36.28 kg/m    Review of Systems: Gen:  Denies  fever, sweats, chills weight loss  HEENT: Denies blurred vision, double vision, ear pain, eye pain, hearing loss, nose bleeds, sore throat Cardiac:  No dizziness, chest pain or heaviness, chest tightness,edema, No JVD Resp:   No cough, -sputum production, -shortness of breath,-wheezing, -hemoptysis,  Other:  All other systems negative   Physical Examination:   General Appearance: No distress  EYES PERRLA, EOM intact.   NECK Supple, No JVD Pulmonary: normal breath sounds, No wheezing.  CardiovascularNormal S1,S2.  No m/r/g.   Abdomen: Benign, Soft, non-tender. Neurology UE/LE 5/5 strength, no focal deficits Ext pulses intact, cap refill intact ALL OTHER ROS ARE NEGATIVE      Assessment and Plan:  42 year old obese white male seen today for follow-up assessment for severe obstructive sleep apnea  Regarding severe sleep apnea Patient has excellent compliance report OSA is well-controlled with CPAP At this time patient is having a hard time with his mask and with increased pressures I recommend mask desensitization referral I will change his auto CPAP to 6-14   Patient Instructions  Continue to use CPAP every night, minimum of 4-6 hours a night.  Change equipment every 30 days or as directed by DME.  Wash your tubing with warm soap and water daily, hang to dry. Wash humidifier portion weekly. Use bottled, distilled water and change daily  Be aware of reduced alertness and do not drive or operate heavy machinery if experiencing this or drowsiness.  Exercise encouraged, as tolerated. Encouraged proper weight management.  Important to get eight or more hours of sleep  Limiting the use of the computer and television before bedtime.  Decrease naps during the day, so night time sleep will become enhanced.  Limit  caffeine, and sleep deprivation.  HTN, stroke, uncontrolled diabetes and heart failure are potential risk factors.  Risk of untreated sleep apnea including cardiac arrhthymias, stroke, DM, pulm HTN.   Smoking Assessment and Cessation Counseling Upon further questioning, Patient smokes/vapes  1/2 pack I have advised patient to quit/stop smoking as soon as possible due to high risk for multiple medical problems  Patient is willing to quit smoking   I have advised patient that we can assist and have options of Nicotine replacement therapy. I also advised patient on behavioral therapy and can provide oral medication therapy in conjunction with the other therapies Follow up next Office visit  for assessment of smoking cessation Smoking cessation counseling advised for 4 minutes   Obesity -recommend significant weight loss -recommend changing diet  Deconditioned state -Recommend increased daily activity and exercise    MEDICATION ADJUSTMENTS/LABS AND TESTS ORDERED: Recommend Mask Fitting Desensitization referral Change AUTO CPAP 6-14 cm h20 Recommend weight loss Avoid secondhand smoke Avoid SICK contacts Recommend  Masking  when appropriate Recommend Keep up-to-date with vaccinations   CURRENT MEDICATIONS REVIEWED AT LENGTH WITH PATIENT TODAY   Patient  satisfied with Plan of action and management. All questions answered  Follow up 3 months   Total Time Spent  42 mins   Lucie Leather, M.D.  [facility]  Medical Director [facility] Medical Director [facility] Cardio-Pulmonary Department

## 2023-01-05 ENCOUNTER — Ambulatory Visit (HOSPITAL_BASED_OUTPATIENT_CLINIC_OR_DEPARTMENT_OTHER): Payer: BC Managed Care – PPO | Attending: Internal Medicine | Admitting: Radiology

## 2023-01-05 ENCOUNTER — Other Ambulatory Visit (HOSPITAL_BASED_OUTPATIENT_CLINIC_OR_DEPARTMENT_OTHER): Payer: BC Managed Care – PPO

## 2023-01-05 DIAGNOSIS — G4733 Obstructive sleep apnea (adult) (pediatric): Secondary | ICD-10-CM

## 2023-03-07 ENCOUNTER — Encounter: Payer: Self-pay | Admitting: Internal Medicine

## 2023-03-07 ENCOUNTER — Ambulatory Visit: Payer: BC Managed Care – PPO | Admitting: Internal Medicine

## 2023-03-07 VITALS — BP 126/88 | HR 82 | Temp 97.7°F | Ht 68.0 in | Wt 243.6 lb

## 2023-03-07 DIAGNOSIS — Z720 Tobacco use: Secondary | ICD-10-CM

## 2023-03-07 DIAGNOSIS — G4733 Obstructive sleep apnea (adult) (pediatric): Secondary | ICD-10-CM | POA: Diagnosis not present

## 2023-03-07 NOTE — Patient Instructions (Signed)
 Excellent [employer]!!  Continue CPAP as prescribed Recommend weight loss  Avoid Allergens and Irritants Avoid secondhand smoke Avoid SICK contacts Recommend  Masking  when appropriate Recommend Keep up-to-date with vaccinations   Be aware of reduced alertness and do not drive or operate heavy machinery if experiencing this or drowsiness.  Exercise encouraged, as tolerated. Encouraged proper weight management.  Important to get eight or more hours of sleep  Limiting the use of the computer and television before bedtime.  Decrease naps during the day, so night time sleep will become enhanced.  Limit caffeine, and sleep deprivation.

## 2023-03-07 NOTE — Progress Notes (Signed)
 [facility] Consultation         Observations/Objective: 12/2020 HST Showed severe obstructive sleep apnea with AHI at 31.3/hour with SPO2 low at 82%.  **CPAP titration 06/27/2017>> recommended auto CPAP with pressure range 14-20. **Home sleep test 05/30/2017>> severe obstructive sleep apnea with AHI 70.   Chief complaint Follow-up assessment for severe sleep apnea  HPI Assessment of sleep apnea Diagnosed with severe sleep apnea with AHI of 70 weight 240 pounds Repeat sleep study when patient weighed 185 pounds showed AHI of 30  Patient was started on CPAP therapy Last previous office visits patient was compliant with his CPAP  Current download Excellent compliance report reviewed in detail with patient 100% for days and greater than 4 hours AHI significant reduced to less than 5 Patient is a benefits from CPAP therapy Uses fullface mask  No evidence of heart failure at this time No evidence or signs of infection at this time No respiratory distress No fevers, chills, nausea, vomiting, diarrhea No evidence of lower extremity edema No evidence hemoptysis  Weight loss recommended current weight 243 pounds Goal weight 195 pounds      Past Medical History:  Diagnosis Date   Anxiety    Depression    Insomnia     Current Outpatient Medications on File Prior to Visit  Medication Sig Dispense Refill   blood glucose meter kit and supplies Dispense based on patient and insurance preference. Use to check blood sugar daily as directed. DX E11.9 (Patient not taking: Reported on 03/29/2022) 1 each 0   No current facility-administered medications on file prior to visit.     BP 126/88 (BP Location: Left Arm, Patient Position: Sitting, Cuff Size: Normal)   Pulse 82   Temp 97.7 F (36.5 C) (Temporal)   Ht 5\' 8"  (1.727 m)   Wt 243 lb 9.6 oz (110.5 kg)   SpO2 98%   BMI 37.04 kg/m      Review of Systems: Gen:  Denies  fever, sweats, chills weight loss   HEENT: Denies blurred vision, double vision, ear pain, eye pain, hearing loss, nose bleeds, sore throat Cardiac:  No dizziness, chest pain or heaviness, chest tightness,edema, No JVD Resp:   No cough, -sputum production, -shortness of breath,-wheezing, -hemoptysis,  Other:  All other systems negative   Physical Examination:   General Appearance: No distress  EYES PERRLA, EOM intact.   NECK Supple, No JVD Pulmonary: normal breath sounds, No wheezing.  CardiovascularNormal S1,S2.  No m/r/g.   Abdomen: Benign, Soft, non-tender. Neurology UE/LE 5/5 strength, no focal deficits Ext pulses intact, cap refill intact ALL OTHER ROS ARE NEGATIVE     Assessment and Plan:   43 year old obese white male seen today for follow-up assessment for severe obstructive sleep apnea  Assessment of Sleep apnea Patient has excellent compliance report Discussed in detail with patient OSA is well-controlled with CPAP Continue current prescription  Patient Instructions Continue to use CPAP every night, minimum of 4-6 hours a night.  Change equipment every 30 days or as directed by DME.  Wash your tubing with warm soap and water daily, hang to dry. Wash humidifier portion weekly. Use bottled, distilled water and change daily   Be aware of reduced alertness and do not drive or operate heavy machinery if experiencing this or drowsiness.  Exercise encouraged, as tolerated. Encouraged proper weight management.  Important to get eight or more hours of sleep  Limiting the use of the computer and television before bedtime.  Decrease  naps during the day, so night time sleep will become enhanced.  Limit caffeine, and sleep deprivation.  HTN, stroke, uncontrolled diabetes and heart failure are potential risk factors.  Risk of untreated sleep apnea including cardiac arrhthymias, stroke, DM, pulm HTN.   Obesity -recommend significant weight loss -recommend changing diet  Deconditioned state -Recommend  increased daily activity and exercise     MEDICATION ADJUSTMENTS/LABS AND TESTS ORDERED: Continue CPAP as prescribed Recommend weight loss Avoid Allergens and Irritants Avoid secondhand smoke Avoid SICK contacts Recommend  Masking  when appropriate Recommend Keep up-to-date with vaccinations   CURRENT MEDICATIONS REVIEWED AT LENGTH WITH PATIENT TODAY   Patient  satisfied with Plan of action and management. All questions answered  Follow up 6 months  Total Time Spent  42 mins   Lady Pier, M.D.  [facility]  Medical Director [facility] Medical Director [facility] Cardio-Pulmonary Department

## 2023-04-18 ENCOUNTER — Encounter: Payer: Self-pay | Admitting: Internal Medicine

## 2023-04-18 ENCOUNTER — Ambulatory Visit: Payer: BC Managed Care – PPO | Admitting: Internal Medicine

## 2023-04-18 VITALS — BP 132/88 | Ht 68.0 in | Wt 242.0 lb

## 2023-04-18 DIAGNOSIS — I10 Essential (primary) hypertension: Secondary | ICD-10-CM

## 2023-04-18 DIAGNOSIS — F32A Depression, unspecified: Secondary | ICD-10-CM

## 2023-04-18 DIAGNOSIS — E66812 Obesity, class 2: Secondary | ICD-10-CM

## 2023-04-18 DIAGNOSIS — G4733 Obstructive sleep apnea (adult) (pediatric): Secondary | ICD-10-CM

## 2023-04-18 DIAGNOSIS — K219 Gastro-esophageal reflux disease without esophagitis: Secondary | ICD-10-CM | POA: Diagnosis not present

## 2023-04-18 DIAGNOSIS — Z6836 Body mass index (BMI) 36.0-36.9, adult: Secondary | ICD-10-CM

## 2023-04-18 DIAGNOSIS — E119 Type 2 diabetes mellitus without complications: Secondary | ICD-10-CM | POA: Diagnosis not present

## 2023-04-18 DIAGNOSIS — E1169 Type 2 diabetes mellitus with other specified complication: Secondary | ICD-10-CM

## 2023-04-18 DIAGNOSIS — E785 Hyperlipidemia, unspecified: Secondary | ICD-10-CM

## 2023-04-18 DIAGNOSIS — F419 Anxiety disorder, unspecified: Secondary | ICD-10-CM

## 2023-04-18 MED ORDER — FLUOXETINE HCL 10 MG PO CAPS: 10.0000 mg | ORAL_CAPSULE | Freq: Every day | ORAL | 1 refills | Status: DC

## 2023-04-18 NOTE — Assessment & Plan Note (Signed)
 Encouraged diet and exercise for weight loss ?

## 2023-04-18 NOTE — Assessment & Plan Note (Signed)
 C-Met and lipid profile today Encouraged him to consume a low-fat diet

## 2023-04-18 NOTE — Assessment & Plan Note (Addendum)
 Deteriorated Will restart fluoxetine 10 mg Continue to meet with your therapist Support offered

## 2023-04-18 NOTE — Patient Instructions (Signed)
 GERD in Adults: What to Know  Gastroesophageal reflux (GER) is when acid from your stomach flows up into your esophagus. Your esophagus is the part of your body that moves food from your mouth to your stomach. Normally, food goes down and stays in your stomach to be digested. But with GER, food and stomach acid may go back up. You may have a disease called gastroesophageal reflux disease (GERD) if the reflux: Happens often. Causes very bad symptoms. Makes your esophagus sore and swollen. Over time, GERD can make small holes called ulcers in the lining of your esophagus. What are the causes? GERD is caused by a problem with the muscle between your esophagus and stomach. This muscle is called the lower esophageal sphincter (LES). When it's weak or not normal, it doesn't close like it should. This means food and stomach acid can go back up into your esophagus. The muscle can be weak if: You smoke or use products with tobacco in them. You're pregnant. You have a type of hernia called a hiatal hernia. You eat certain foods and drinks. These include: Alcohol. Coffee. Chocolate. Onions. Peppermint. What increases the risk? Being overweight. Having a disease that affects your connective tissue. Taking NSAIDs, such as ibuprofen. What are the signs or symptoms? Heartburn. Trouble swallowing. Pain when you swallow. The feeling of having a lump in your throat. A bitter taste in your mouth. Bad breath. Having an upset or bloated stomach. Burping. Chest pain. Other conditions can also cause chest pain. Make sure you see your health care provider if you have chest pain. Wheezing. This is when you make high-pitched whistling sounds when you breathe, most often when you breathe out. A long-term cough or a cough at night. How is this diagnosed? GERD may be diagnosed based on your medical history and a physical exam. You may also have tests. These may include: An endoscopy. This test looks at your  stomach and esophagus with a small camera. A barium swallow test. This shows the shape and size of your esophagus and how well it's working. Tests of your esophagus to check for: Acid levels. Pressure. How is this treated? Treatment may depend on how bad your symptoms are. It may include: Changes to your diet and daily life. Medicines. Surgery. Follow these instructions at home: Eating and drinking Follow an eating plan as told by your provider. You may need to avoid certain foods and drinks. These may include: Coffee and tea, with or without caffeine. Alcohol. Energy drinks and sports drinks. Fizzy drinks or sodas. Chocolate and cocoa. Peppermint and mint flavorings. Garlic and onions. Horseradish. Spicy and acidic foods. These include: Peppers. Chili powder and curry powder. Vinegar. Hot sauces and BBQ sauce. Citrus fruits and juices. These include: Oranges. Lemons. Limes. Tomato-based foods. These include: Red sauce and pizza with red sauce. Chili. Salsa. Fried and fatty foods. These include: Donuts. [place] fries. Potato chips. High-fat dressings. High-fat meats. These include: Hot dogs and sausage. Rib eye steak. Ham and bacon. High-fat dairy items. These include: Whole milk. Butter. Cream cheese. Eat small meals often. Avoid eating big meals. Avoid drinking lots of liquid with your meals. Try not to eat meals during the 2-3 hours before bedtime. Try not to lie down right after you eat. Do not exercise right after you eat. Lifestyle  If you're overweight, lose an amount of weight that's healthy for you. Ask your provider about a safe weight loss goal. Do not smoke, vape, or use nicotine or tobacco. Wear  loose clothes. Do not wear things that are tight around your waist. When you sleep, try: Raising the head of your bed about 6 inches (15 cm). You can use a wedge to do this. Lying down on your left side. Try to lower your stress. If you need help doing  this, ask your provider. General instructions Take your medicines only as told. Do not take aspirin or ibuprofen unless you're told to. Watch for any changes in your symptoms. Do not bend over if it makes your symptoms worse. Contact a health care provider if: You have new symptoms. You have trouble: Drinking. Swallowing. Eating. It hurts to swallow. You have wheezing. You have a cough that won't go away. Your voice is hoarse. Your symptoms don't get better with treatment. Get help right away if: You have pain all of a sudden in your: Arm. Neck. Jaw. Teeth. Back. You feel sweaty, dizzy, or light-headed all of a sudden. You faint. You have chest pain or shortness of breath. You vomit and the vomit is: Green, yellow, or black. Looks like blood or coffee grounds. Your poop is red, bloody, or black. These symptoms may be an emergency. Call 911 right away. Do not wait to see if the symptoms will go away. Do not drive yourself to the hospital. This information is not intended to replace advice given to you by your health care provider. Make sure you discuss any questions you have with your health care provider. Document Revised: 11/23/2022 Document Reviewed: 06/09/2022 Elsevier Patient Education  2024 [employer].

## 2023-04-18 NOTE — Assessment & Plan Note (Addendum)
 Encouraged weight loss as this can help reduce reflux symptoms Continue CPAP use

## 2023-04-18 NOTE — Assessment & Plan Note (Signed)
Controlled off meds Reinforced DASH diet and continued weight loss CMET today

## 2023-04-18 NOTE — Assessment & Plan Note (Signed)
 A1c today Will obtain urine microalbumin at next visit Encouraged him to consume a low-carb diet and continue exercise weight loss Not medicated Encourage routine eye exams Encourage routine foot exams He declines flu shot, Pneumovax and COVID-vaccine

## 2023-04-18 NOTE — Assessment & Plan Note (Signed)
Avoid foods that trigger your reflux Okay to continue medications OTC Encourage continued weight loss as this can produce reflux symptoms

## 2023-04-18 NOTE — Progress Notes (Signed)
 Subjective:    Patient ID: Anthony Myers, male    DOB: Feb 10, 1980, 43 y.o.   MRN: [id number]  HPI  Patient presents the clinic today for follow-up chronic conditions.  HTN: His BP today is 119/68.  He is not taking any antihypertensive medication but has been on lisinopril in the past. ECG from 02/2015 reviewed.  HLD: His last LDL was 49, triglycerides 54, 09/2022.  He is not taking any cholesterol-lowering medication at this time.  He consumes a low-fat diet.  DM2: His last A1c was 5.4%, 09/2022.  He is not taking any oral diabetic medication at this time.  He does not check his sugars.  His last eye exam was more than 1 year ago. He checks his feet routinely. Flu 02/2015. Pneumovax never.  Anxiety and depression: He has been feeling more irritable lately. He is not currently taking any medications for this but has been on fluoxetine and bupropion in the past.  He is currently seeing a therapist.  He denies SI/HI.  OSA: He averages 5 hours with the use of his CPAP.  Sleep study from 01/2021 reviewed.  GERD: Triggered by sweets and fried foods. He takes zegrid or tums chews OTC with good relief of symptoms. There is no upper GI on file.   Review of Systems     Past Medical History:  Diagnosis Date  . Anxiety   . Depression   . Insomnia     No current outpatient medications on file.   No current facility-administered medications for this visit.    Allergies  Allergen Reactions  . Tylenol [Acetaminophen] Swelling    "feels bad"    Family History  Problem Relation Age of Onset  . Mental illness Mother   . Hypertension Father     Social History   Socioeconomic History  . Marital status: Single    Spouse name: Not on file  . Number of children: Not on file  . Years of education: Not on file  . Highest education level: Not on file  Occupational History  . Not on file  Tobacco Use  . Smoking status: Every Day    Types: E-cigarettes  . Smokeless tobacco: Never  .  Tobacco comments:    0.5 ppd -02/02/2021   Vaping Use  . Vaping status: Every Day  Substance and Sexual Activity  . Alcohol use: No    Alcohol/week: 0.0 standard drinks of alcohol  . Drug use: No  . Sexual activity: Yes  Other Topics Concern  . Not on file  Social History Narrative  . Not on file   Social Drivers of Health   Financial Resource Strain: High Risk (10/18/2022)   Overall Financial Resource Strain (CARDIA)   . Difficulty of Paying Living Expenses: Hard  Food Insecurity: No Food Insecurity (10/18/2022)   Hunger Vital Sign   . Worried About Programme researcher, broadcasting/film/video in the Last Year: Never true   . Ran Out of Food in the Last Year: Never true  Transportation Needs: No Transportation Needs (10/18/2022)   PRAPARE - Transportation   . Lack of Transportation (Medical): No   . Lack of Transportation (Non-Medical): No  Physical Activity: Unknown (10/18/2022)   Exercise Vital Sign   . Days of Exercise per Week: Patient declined   . Minutes of Exercise per Session: Not on file  Stress: Stress Concern Present (10/18/2022)   [employer] of Occupational Health - Occupational Stress Questionnaire   . Feeling of Stress : To some  extent  Social Connections: Moderately Integrated (10/18/2022)   Social Connection and Isolation Panel [NHANES]   . Frequency of Communication with Friends and Family: More than three times a week   . Frequency of Social Gatherings with Friends and Family: Twice a week   . Attends Religious Services: More than 4 times per year   . Active Member of Clubs or Organizations: Yes   . Attends Banker Meetings: Never   . Marital Status: Divorced  Intimate Partner Violence: Unknown (04/27/2021)   Received from [facility]   HITS   . Physically Hurt: Not on file   . Insult or Talk Down To: Not on file   . Threaten Physical Harm: Not on file   . Scream or Curse: Not on file     Constitutional: Denies fever, malaise, fatigue, headache  or abrupt weight changes.  HEENT: Denies eye pain, eye redness, ear pain, ringing in the ears, wax buildup, runny nose, nasal congestion, bloody nose, or sore throat. Respiratory: Denies difficulty breathing, shortness of breath, cough or sputum production.   Cardiovascular: Denies chest pain, chest tightness, palpitations or swelling in the hands or feet.  Gastrointestinal: Pt reports intermittent reflux. Denies abdominal pain, bloating, constipation, diarrhea or blood in the stool.  GU: Denies urgency, frequency, pain with urination, burning sensation, blood in urine, odor or discharge. Musculoskeletal: Denies decrease in range of motion, difficulty with gait, muscle pain or joint pain and swelling.  Skin: Denies redness, rashes, lesions or ulcercations.  Neurological: Denies dizziness, difficulty with memory, difficulty with speech or problems with balance and coordination.  Psych: Patient has a history of anxiety and depression.  Denies SI/HI.  No other specific complaints in a complete review of systems (except as listed in HPI above).  Objective:   Physical Exam  BP 132/88 (BP Location: Left Arm, Patient Position: Sitting, Cuff Size: Normal)   Ht 5\' 8"  (1.727 m)   Wt 242 lb (109.8 kg)   BMI 36.80 kg/m    Wt Readings from Last 3 Encounters:  03/07/23 243 lb 9.6 oz (110.5 kg)  01/05/23 238 lb (108 kg)  12/15/22 238 lb 9.6 oz (108.2 kg)    General: Appears his stated age, obese, in NAD. Skin: Warm, dry and intact. No rashes noted. HEENT: Head: normal shape and size; Eyes: sclera white and EOMs intact;   Cardiovascular: Normal rate and rhythm. S1,S2 noted.  No murmur, rubs or gallops noted. No JVD or BLE edema.  Pulmonary/Chest: Normal effort and positive vesicular breath sounds. No respiratory distress. No wheezes, rales or ronchi noted.  Abdomen: Soft and nontender.  Musculoskeletal: Strength 5/5 BUE/BLE. No difficulty with gait.  Neurological: Alert and oriented. Cranial  nerves II-XII grossly intact. Coordination normal.  Psychiatric: Mood and affect normal. Mildy anxious appearing. Judgment and thought content normal.     BMET    Component Value Date/Time   NA 143 10/18/2022 1516   NA 142 01/14/2013 1416   K 4.4 10/18/2022 1516   K 4.1 01/14/2013 1416   CL 105 10/18/2022 1516   CL 109 (H) 01/14/2013 1416   CO2 28 10/18/2022 1516   CO2 27 01/14/2013 1416   GLUCOSE 82 10/18/2022 1516   GLUCOSE 113 (H) 01/14/2013 1416   BUN 10 10/18/2022 1516   BUN 11 01/14/2013 1416   CREATININE 0.76 10/18/2022 1516   CALCIUM 9.8 10/18/2022 1516   CALCIUM 9.3 01/14/2013 1416   GFRNONAA >60 01/14/2013 1416   GFRAA >60 01/14/2013  1416    Lipid Panel     Component Value Date/Time   CHOL 107 10/18/2022 1516   TRIG 54 10/18/2022 1516   HDL 45 10/18/2022 1516   CHOLHDL 2.4 10/18/2022 1516   VLDL 25.4 02/12/2019 1546   LDLCALC 49 10/18/2022 1516    CBC    Component Value Date/Time   WBC 7.4 10/18/2022 1516   RBC 5.20 10/18/2022 1516   HGB 15.1 10/18/2022 1516   HGB 15.5 01/14/2013 1416   HCT 44.8 10/18/2022 1516   HCT 48.2 01/14/2013 1416   PLT 217 10/18/2022 1516   PLT 205 01/14/2013 1416   MCV 86.2 10/18/2022 1516   MCV 85 01/14/2013 1416   MCH 29.0 10/18/2022 1516   MCHC 33.7 10/18/2022 1516   RDW 12.4 10/18/2022 1516   RDW 14.0 01/14/2013 1416   LYMPHSABS 2.6 01/11/2014 1540   MONOABS 0.9 01/11/2014 1540   EOSABS 0.2 01/11/2014 1540   BASOSABS 0.1 01/11/2014 1540    Hgb A1C Lab Results  Component Value Date   HGBA1C 5.4 10/18/2022           Assessment & Plan:    RTC in 6 months for your annual exam Nicki Reaper, NP

## 2023-04-19 ENCOUNTER — Encounter: Payer: Self-pay | Admitting: Internal Medicine

## 2023-04-19 LAB — CBC
HCT: 41.6 % (ref 38.5–50.0)
Hemoglobin: 14 g/dL (ref 13.2–17.1)
MCH: 28.7 pg (ref 27.0–33.0)
MCHC: 33.7 g/dL (ref 32.0–36.0)
MCV: 85.2 fL (ref 80.0–100.0)
MPV: 10.3 fL (ref 7.5–12.5)
Platelets: 210 10*3/uL (ref 140–400)
RBC: 4.88 10*6/uL (ref 4.20–5.80)
RDW: 12.5 % (ref 11.0–15.0)
WBC: 6.7 10*3/uL (ref 3.8–10.8)

## 2023-04-19 LAB — COMPLETE METABOLIC PANEL WITH GFR
AG Ratio: 2.1 (calc) (ref 1.0–2.5)
ALT: 117 U/L — ABNORMAL HIGH (ref 9–46)
AST: 45 U/L — ABNORMAL HIGH (ref 10–40)
Albumin: 4.6 g/dL (ref 3.6–5.1)
Alkaline phosphatase (APISO): 56 U/L (ref 36–130)
BUN/Creatinine Ratio: 24 (calc) — ABNORMAL HIGH (ref 6–22)
BUN: 12 mg/dL (ref 7–25)
CO2: 24 mmol/L (ref 20–32)
Calcium: 9.1 mg/dL (ref 8.6–10.3)
Chloride: 106 mmol/L (ref 98–110)
Creat: 0.51 mg/dL — ABNORMAL LOW (ref 0.60–1.29)
Globulin: 2.2 g/dL (ref 1.9–3.7)
Glucose, Bld: 85 mg/dL (ref 65–139)
Potassium: 3.6 mmol/L (ref 3.5–5.3)
Sodium: 141 mmol/L (ref 135–146)
Total Bilirubin: 0.4 mg/dL (ref 0.2–1.2)
Total Protein: 6.8 g/dL (ref 6.1–8.1)

## 2023-04-19 LAB — HEMOGLOBIN A1C
Hgb A1c MFr Bld: 5.3 %{Hb} (ref <5.7)
Mean Plasma Glucose: 105 mg/dL
eAG (mmol/L): 5.8 mmol/L

## 2023-04-19 LAB — LIPID PANEL
Cholesterol: 83 mg/dL (ref <200)
HDL: 35 mg/dL — ABNORMAL LOW (ref >=40)
LDL Cholesterol (Calc): 31 mg/dL
Non-HDL Cholesterol (Calc): 48 mg/dL (ref <130)
Total CHOL/HDL Ratio: 2.4 (calc) (ref <5.0)
Triglycerides: 89 mg/dL (ref <150)

## 2023-10-24 ENCOUNTER — Encounter: Admitting: Internal Medicine

## 2023-12-13 ENCOUNTER — Ambulatory Visit (INDEPENDENT_AMBULATORY_CARE_PROVIDER_SITE_OTHER): Admitting: Internal Medicine

## 2023-12-13 ENCOUNTER — Encounter: Payer: Self-pay | Admitting: Internal Medicine

## 2023-12-13 VITALS — BP 122/88 | Ht 68.0 in | Wt 252.6 lb

## 2023-12-13 DIAGNOSIS — E1169 Type 2 diabetes mellitus with other specified complication: Secondary | ICD-10-CM

## 2023-12-13 DIAGNOSIS — E119 Type 2 diabetes mellitus without complications: Secondary | ICD-10-CM

## 2023-12-13 DIAGNOSIS — E66812 Obesity, class 2: Secondary | ICD-10-CM | POA: Diagnosis not present

## 2023-12-13 DIAGNOSIS — Z6838 Body mass index (BMI) 38.0-38.9, adult: Secondary | ICD-10-CM

## 2023-12-13 DIAGNOSIS — Z0001 Encounter for general adult medical examination with abnormal findings: Secondary | ICD-10-CM

## 2023-12-13 DIAGNOSIS — E785 Hyperlipidemia, unspecified: Secondary | ICD-10-CM

## 2023-12-13 NOTE — Patient Instructions (Signed)
 Health Maintenance, Male  Adopting a healthy lifestyle and getting preventive care are important in promoting health and wellness. Ask your health care provider about:  The right schedule for you to have regular tests and exams.  Things you can do on your own to prevent diseases and keep yourself healthy.  What should I know about diet, weight, and exercise?  Eat a healthy diet    Eat a diet that includes plenty of vegetables, fruits, low-fat dairy products, and lean protein.  Do not eat a lot of foods that are high in solid fats, added sugars, or sodium.  Maintain a healthy weight  Body mass index (BMI) is a measurement that can be used to identify possible weight problems. It estimates body fat based on height and weight. Your health care provider can help determine your BMI and help you achieve or maintain a healthy weight.  Get regular exercise  Get regular exercise. This is one of the most important things you can do for your health. Most adults should:  Exercise for at least 150 minutes each week. The exercise should increase your heart rate and make you sweat (moderate-intensity exercise).  Do strengthening exercises at least twice a week. This is in addition to the moderate-intensity exercise.  Spend less time sitting. Even light physical activity can be beneficial.  Watch cholesterol and blood lipids  Have your blood tested for lipids and cholesterol at 43 years of age, then have this test every 5 years.  You may need to have your cholesterol levels checked more often if:  Your lipid or cholesterol levels are high.  You are older than 43 years of age.  You are at high risk for heart disease.  What should I know about cancer screening?  Many types of cancers can be detected early and may often be prevented. Depending on your health history and family history, you may need to have cancer screening at various ages. This may include screening for:  Colorectal cancer.  Prostate cancer.  Skin cancer.  Lung  cancer.  What should I know about heart disease, diabetes, and high blood pressure?  Blood pressure and heart disease  High blood pressure causes heart disease and increases the risk of stroke. This is more likely to develop in people who have high blood pressure readings or are overweight.  Talk with your health care provider about your target blood pressure readings.  Have your blood pressure checked:  Every 3-5 years if you are 24-52 years of age.  Every year if you are 3 years old or older.  If you are between the ages of 60 and 72 and are a current or former smoker, ask your health care provider if you should have a one-time screening for abdominal aortic aneurysm (AAA).  Diabetes  Have regular diabetes screenings. This checks your fasting blood sugar level. Have the screening done:  Once every three years after age 66 if you are at a normal weight and have a low risk for diabetes.  More often and at a younger age if you are overweight or have a high risk for diabetes.  What should I know about preventing infection?  Hepatitis B  If you have a higher risk for hepatitis B, you should be screened for this virus. Talk with your health care provider to find out if you are at risk for hepatitis B infection.  Hepatitis C  Blood testing is recommended for:  Everyone born from [birth year] through 1965.  Anyone  with known risk factors for hepatitis C.  Sexually transmitted infections (STIs)  You should be screened each year for STIs, including gonorrhea and chlamydia, if:  You are sexually active and are younger than 43 years of age.  You are older than 43 years of age and your health care provider tells you that you are at risk for this type of infection.  Your sexual activity has changed since you were last screened, and you are at increased risk for chlamydia or gonorrhea. Ask your health care provider if you are at risk.  Ask your health care provider about whether you are at high risk for HIV. Your health care provider  may recommend a prescription medicine to help prevent HIV infection. If you choose to take medicine to prevent HIV, you should first get tested for HIV. You should then be tested every 3 months for as long as you are taking the medicine.  Follow these instructions at home:  Alcohol use  Do not drink alcohol if your health care provider tells you not to drink.  If you drink alcohol:  Limit how much you have to 0-2 drinks a day.  Know how much alcohol is in your drink. In the [place] one drink equals one 12 oz bottle of beer (355 mL), one 5 oz glass of wine (148 mL), or one 1 oz glass of hard liquor (44 mL).  Lifestyle  Do not use any products that contain nicotine or tobacco. These products include cigarettes, chewing tobacco, and vaping devices, such as e-cigarettes. If you need help quitting, ask your health care provider.  Do not use street drugs.  Do not share needles.  Ask your health care provider for help if you need support or information about quitting drugs.  General instructions  Schedule regular health, dental, and eye exams.  Stay current with your vaccines.  Tell your health care provider if:  You often feel depressed.  You have ever been abused or do not feel safe at home.  Summary  Adopting a healthy lifestyle and getting preventive care are important in promoting health and wellness.  Follow your health care provider's instructions about healthy diet, exercising, and getting tested or screened for diseases.  Follow your health care provider's instructions on monitoring your cholesterol and blood pressure.  This information is not intended to replace advice given to you by your health care provider. Make sure you discuss any questions you have with your health care provider.  Document Revised: 06/02/2020 Document Reviewed: 06/02/2020  Elsevier Patient Education  2024 [employer].

## 2023-12-13 NOTE — Assessment & Plan Note (Signed)
 Encouraged diet and exercise for weight loss ?

## 2023-12-13 NOTE — Progress Notes (Unsigned)
 Subjective:    Patient ID: Anthony Myers, male    DOB: [birth year], 43 y.o.   MRN: [id number]  HPI  Patient presents to clinic today for his annual exam.  Flu: 02/2015 Tetanus: 09/2014 COVID: x 2 Vision screening: as needed Dentist: as needed  Diet: He does eat meat. He consumes fruits and veggies. He does eat fried foods. He drinks mostly water, some dt soda Exercise: None  Review of Systems     Past Medical History:  Diagnosis Date   Anxiety    Depression    Insomnia     Current Outpatient Medications  Medication Sig Dispense Refill   FLUoxetine  (PROZAC ) 10 MG capsule Take 1 capsule (10 mg total) by mouth daily. 90 capsule 1   No current facility-administered medications for this visit.    Allergies  Allergen Reactions   Tylenol  [Acetaminophen ] Swelling    feels bad    Family History  Problem Relation Age of Onset   Mental illness Mother    Hypertension Father     Social History   Socioeconomic History   Marital status: Single    Spouse name: Not on file   Number of children: Not on file   Years of education: Not on file   Highest education level: Not on file  Occupational History   Not on file  Tobacco Use   Smoking status: Every Day    Types: E-cigarettes   Smokeless tobacco: Never   Tobacco comments:    0.5 ppd -02/02/2021   Vaping Use   Vaping status: Every Day  Substance and Sexual Activity   Alcohol use: No    Alcohol/week: 0.0 standard drinks of alcohol   Drug use: No   Sexual activity: Yes  Other Topics Concern   Not on file  Social History Narrative   Not on file   Social Drivers of Health   Financial Resource Strain: Patient Declined (04/18/2023)   Overall Financial Resource Strain (CARDIA)    Difficulty of Paying Living Expenses: Patient declined  Food Insecurity: Patient Declined (04/18/2023)   Hunger Vital Sign    Worried About Running Out of Food in the Last Year: Patient declined    Ran Out of Food in the Last Year:  Patient declined  Transportation Needs: Patient Declined (04/18/2023)   PRAPARE - Administrator, Civil Service (Medical): Patient declined    Lack of Transportation (Non-Medical): Patient declined  Physical Activity: Unknown (04/18/2023)   Exercise Vital Sign    Days of Exercise per Week: Patient declined    Minutes of Exercise per Session: Not on file  Stress: Patient Declined (04/18/2023)   [employer] of Occupational Health - Occupational Stress Questionnaire    Feeling of Stress : Patient declined  Social Connections: Unknown (04/18/2023)   Social Connection and Isolation Panel    Frequency of Communication with Friends and Family: Patient declined    Frequency of Social Gatherings with Friends and Family: Patient declined    Attends Religious Services: Patient declined    Database Administrator or Organizations: Patient declined    Attends Banker Meetings: Never    Marital Status: Patient declined  Catering Manager Violence: Not on file     Constitutional: Denies fever, malaise, fatigue, headache or abrupt weight changes.  HEENT: Denies eye pain, eye redness, ear pain, ringing in the ears, wax buildup, runny nose, nasal congestion, bloody nose, or sore throat. Respiratory: Denies difficulty breathing, shortness of breath, cough or sputum  production.   Cardiovascular: Denies chest pain, chest tightness, palpitations or swelling in the hands or feet.  Gastrointestinal: Denies abdominal pain, bloating, constipation, diarrhea or blood in the stool.  GU: Pt reports erectile dysfunction. Denies urgency, frequency, pain with urination, burning sensation, blood in urine, odor or discharge. Musculoskeletal: Denies decrease in range of motion, difficulty with gait, muscle pain or joint pain and swelling.  Skin: Denies redness, rashes, lesions or ulcercations.  Neurological: Denies dizziness, difficulty with memory, difficulty with speech or problems with balance  and coordination.  Psych: Patient has a history of anxiety and depression.  Denies SI/HI.  No other specific complaints in a complete review of systems (except as listed in HPI above).  Objective:   Physical Exam BP 122/88 (BP Location: Left Arm, Patient Position: Sitting, Cuff Size: Large)   Ht 5' 8 (1.727 m)   Wt 252 lb 9.6 oz (114.6 kg)   BMI 38.41 kg/m    Wt Readings from Last 3 Encounters:  04/18/23 242 lb (109.8 kg)  03/07/23 243 lb 9.6 oz (110.5 kg)  01/05/23 238 lb (108 kg)    General: Appears his stated age, obese, in NAD. Skin: Warm, dry and intact.  HEENT: Head: normal shape and size; Eyes: sclera white, no icterus, conjunctiva pink, PERRLA and EOMs intact;  Neck:  Neck supple, trachea midline. No masses, lumps or thyromegaly present.  Cardiovascular: Normal rate and rhythm. S1,S2 noted.  No murmur, rubs or gallops noted. No JVD or BLE edema.  Pulmonary/Chest: Normal effort and positive vesicular breath sounds. No respiratory distress. No wheezes, rales or ronchi noted.  Abdomen: Soft and nontender. Normal bowel sounds.  Musculoskeletal: Strength 5/5 BUE/BLE. No difficulty with gait.  Neurological: Alert and oriented. Cranial nerves II-XII grossly intact. Coordination normal.  Psychiatric: Mood and affect normal. Behavior is normal. Judgment and thought content normal.     BMET    Component Value Date/Time   NA 141 04/18/2023 1542   NA 142 01/14/2013 1416   K 3.6 04/18/2023 1542   K 4.1 01/14/2013 1416   CL 106 04/18/2023 1542   CL 109 (H) 01/14/2013 1416   CO2 24 04/18/2023 1542   CO2 27 01/14/2013 1416   GLUCOSE 85 04/18/2023 1542   GLUCOSE 113 (H) 01/14/2013 1416   BUN 12 04/18/2023 1542   BUN 11 01/14/2013 1416   CREATININE 0.51 (L) 04/18/2023 1542   CALCIUM 9.1 04/18/2023 1542   CALCIUM 9.3 01/14/2013 1416   GFRNONAA >60 01/14/2013 1416   GFRAA >60 01/14/2013 1416    Lipid Panel     Component Value Date/Time   CHOL 83 04/18/2023 1542   TRIG  89 04/18/2023 1542   HDL 35 (L) 04/18/2023 1542   CHOLHDL 2.4 04/18/2023 1542   VLDL 25.4 02/12/2019 1546   LDLCALC 31 04/18/2023 1542    CBC    Component Value Date/Time   WBC 6.7 04/18/2023 1542   RBC 4.88 04/18/2023 1542   HGB 14.0 04/18/2023 1542   HGB 15.5 01/14/2013 1416   HCT 41.6 04/18/2023 1542   HCT 48.2 01/14/2013 1416   PLT 210 04/18/2023 1542   PLT 205 01/14/2013 1416   MCV 85.2 04/18/2023 1542   MCV 85 01/14/2013 1416   MCH 28.7 04/18/2023 1542   MCHC 33.7 04/18/2023 1542   RDW 12.5 04/18/2023 1542   RDW 14.0 01/14/2013 1416   LYMPHSABS 2.6 01/11/2014 1540   MONOABS 0.9 01/11/2014 1540   EOSABS 0.2 01/11/2014 1540   BASOSABS 0.1  01/11/2014 1540    Hgb A1C Lab Results  Component Value Date   HGBA1C 5.3 04/18/2023            Assessment & Plan:  Preventative health maintenance:  Flu shot declined Tetanus UTD Encouraged him to get his COVID-vaccine Encouraged him to consume a balanced diet and exercise regimen Advised him to see an eye doctor and dentist annually Will check CBC, c-Met, lipid, A1c and urine microalbumin today  RTC in 6 months, follow-up chronic conditions Angeline Laura, NP

## 2023-12-14 ENCOUNTER — Encounter: Payer: Self-pay | Admitting: Internal Medicine

## 2023-12-14 ENCOUNTER — Ambulatory Visit: Payer: Self-pay | Admitting: Internal Medicine

## 2023-12-14 LAB — CBC
HCT: 45.6 % (ref 38.5–50.0)
Hemoglobin: 15.1 g/dL (ref 13.2–17.1)
MCH: 28.4 pg (ref 27.0–33.0)
MCHC: 33.1 g/dL (ref 32.0–36.0)
MCV: 85.7 fL (ref 80.0–100.0)
MPV: 10.2 fL (ref 7.5–12.5)
Platelets: 233 Thousand/uL (ref 140–400)
RBC: 5.32 Million/uL (ref 4.20–5.80)
RDW: 12.7 % (ref 11.0–15.0)
WBC: 7.1 Thousand/uL (ref 3.8–10.8)

## 2023-12-14 LAB — LIPID PANEL
Cholesterol: 111 mg/dL (ref <200)
HDL: 48 mg/dL (ref >=40)
LDL Cholesterol (Calc): 45 mg/dL
Non-HDL Cholesterol (Calc): 63 mg/dL (ref <130)
Total CHOL/HDL Ratio: 2.3 (calc) (ref <5.0)
Triglycerides: 93 mg/dL (ref <150)

## 2023-12-14 LAB — COMPREHENSIVE METABOLIC PANEL WITH GFR
AG Ratio: 2.2 (calc) (ref 1.0–2.5)
ALT: 37 U/L (ref 9–46)
AST: 28 U/L (ref 10–40)
Albumin: 5.1 g/dL (ref 3.6–5.1)
Alkaline phosphatase (APISO): 62 U/L (ref 36–130)
BUN: 11 mg/dL (ref 7–25)
CO2: 25 mmol/L (ref 20–32)
Calcium: 9.7 mg/dL (ref 8.6–10.3)
Chloride: 103 mmol/L (ref 98–110)
Creat: 0.76 mg/dL (ref 0.60–1.29)
Globulin: 2.3 g/dL (ref 1.9–3.7)
Glucose, Bld: 83 mg/dL (ref 65–99)
Potassium: 4 mmol/L (ref 3.5–5.3)
Sodium: 139 mmol/L (ref 135–146)
Total Bilirubin: 0.5 mg/dL (ref 0.2–1.2)
Total Protein: 7.4 g/dL (ref 6.1–8.1)
eGFR: 114 mL/min/1.73m2 (ref >=60)

## 2023-12-14 LAB — HEMOGLOBIN A1C
Hgb A1c MFr Bld: 5.4 % (ref <5.7)
Mean Plasma Glucose: 108 mg/dL
eAG (mmol/L): 6 mmol/L

## 2023-12-14 LAB — MICROALBUMIN / CREATININE URINE RATIO
Creatinine, Urine: 40 mg/dL (ref 20–320)
Microalb Creat Ratio: 23 mg/g{creat} (ref <30)
Microalb, Ur: 0.9 mg/dL

## 2024-06-11 ENCOUNTER — Ambulatory Visit: Admitting: Internal Medicine
